# Patient Record
Sex: Male | Born: 2000 | Race: White | Hispanic: No | Marital: Single | Smoking: Never smoker
Health system: Southern US, Community
[De-identification: ages and names within clinical notes are randomized; demographics above are authoritative.]

## PROBLEM LIST (undated history)

## (undated) DIAGNOSIS — E049 Nontoxic goiter, unspecified: Secondary | ICD-10-CM

## (undated) DIAGNOSIS — E1010 Type 1 diabetes mellitus with ketoacidosis without coma: Secondary | ICD-10-CM

## (undated) DIAGNOSIS — E109 Type 1 diabetes mellitus without complications: Secondary | ICD-10-CM

## (undated) DIAGNOSIS — F909 Attention-deficit hyperactivity disorder, unspecified type: Secondary | ICD-10-CM

## (undated) DIAGNOSIS — R6250 Unspecified lack of expected normal physiological development in childhood: Secondary | ICD-10-CM

## (undated) DIAGNOSIS — E11649 Type 2 diabetes mellitus with hypoglycemia without coma: Secondary | ICD-10-CM

## (undated) HISTORY — DX: Unspecified lack of expected normal physiological development in childhood: R62.50

## (undated) HISTORY — DX: Type 1 diabetes mellitus with ketoacidosis without coma: E10.10

## (undated) HISTORY — DX: Type 2 diabetes mellitus with hypoglycemia without coma: E11.649

## (undated) HISTORY — DX: Attention-deficit hyperactivity disorder, unspecified type: F90.9

## (undated) HISTORY — DX: Nontoxic goiter, unspecified: E04.9

## (undated) HISTORY — DX: Type 1 diabetes mellitus without complications: E10.9

---
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
## 2020-01-02 ENCOUNTER — Other Ambulatory Visit (INDEPENDENT_AMBULATORY_CARE_PROVIDER_SITE_OTHER): Payer: Self-pay | Admitting: Family

## 2020-01-03 ENCOUNTER — Telehealth (INDEPENDENT_AMBULATORY_CARE_PROVIDER_SITE_OTHER): Payer: Self-pay | Admitting: Family

## 2020-01-03 MED ORDER — DEXCOM G6 SENSOR MISC: 1.0000 | 0 refills | Status: DC

## 2020-01-03 NOTE — Telephone Encounter (Signed)
  Who's calling (name and relationship to patient) : Abeer Iversen (mom)  Best contact number: [phone]  Provider they see: Gretchen Short  Reason for call: Patient has only one Dexcom sensor left and needs refill sent to pharmacy. Mom states that [facility] told her that Rx would need to be sent to local pharmacy. She would [facility] and also to [facility], if possible so she can compare pricing. If it is not possible to send to two pharmacies, than just [facility] the [facility].    PRESCRIPTION REFILL ONLY  Name of prescription: Continuous Blood Gluc Sensor (DEXCOM G6 SENSOR) MISC  [id number] [place], [place] - [address]

## 2020-01-03 NOTE — Addendum Note (Signed)
Addended by: Osa Craver on: 01/03/2020 10:28 AM   Modules accepted: Orders

## 2020-01-17 ENCOUNTER — Ambulatory Visit (INDEPENDENT_AMBULATORY_CARE_PROVIDER_SITE_OTHER): Payer: Managed Care, Other (non HMO) | Admitting: Family

## 2020-02-01 ENCOUNTER — Ambulatory Visit (INDEPENDENT_AMBULATORY_CARE_PROVIDER_SITE_OTHER): Payer: Managed Care, Other (non HMO) | Admitting: Family

## 2020-02-01 ENCOUNTER — Encounter (INDEPENDENT_AMBULATORY_CARE_PROVIDER_SITE_OTHER): Payer: Self-pay | Admitting: Family

## 2020-02-01 ENCOUNTER — Other Ambulatory Visit: Payer: Self-pay

## 2020-02-01 VITALS — BP 114/72 | HR 78 | Ht 69.0 in | Wt 165.4 lb

## 2020-02-01 DIAGNOSIS — E1065 Type 1 diabetes mellitus with hyperglycemia: Secondary | ICD-10-CM

## 2020-02-01 DIAGNOSIS — F54 Psychological and behavioral factors associated with disorders or diseases classified elsewhere: Secondary | ICD-10-CM

## 2020-02-01 DIAGNOSIS — R739 Hyperglycemia, unspecified: Secondary | ICD-10-CM | POA: Diagnosis not present

## 2020-02-01 DIAGNOSIS — Z4681 Encounter for fitting and adjustment of insulin pump: Secondary | ICD-10-CM

## 2020-02-01 DIAGNOSIS — E10649 Type 1 diabetes mellitus with hypoglycemia without coma: Secondary | ICD-10-CM | POA: Diagnosis not present

## 2020-02-01 DIAGNOSIS — R7309 Other abnormal glucose: Secondary | ICD-10-CM

## 2020-02-01 LAB — POCT GLUCOSE (DEVICE FOR HOME USE): Glucose Fasting, POC: 131 mg/dL — AB (ref 70–99)

## 2020-02-01 LAB — POCT GLYCOSYLATED HEMOGLOBIN (HGB A1C): Hemoglobin A1C: 8.1 % — AB (ref 4.0–5.6)

## 2020-02-01 NOTE — Progress Notes (Signed)
Pediatric Endocrinology Diabetes Consultation Follow-up Visit  Ethan Acevedo 2000-07-28 [id number]  Chief Complaint: Follow-up type 1 diabetes   [name], MD   HPI: Ethan Acevedo  is a 19 y.o. male presenting for follow-up of type 1 diabetes. he is accompanied to this visit by his mother.   1. Ethan Acevedo was admitted to pediatric ward of the [facility] on 01/28/07 for new onset type 1 diabetes mellitus, mild-moderate diabetic ketoacidosis, dehydration, and ketonuria. He was 94-1/19 years old. He had about a 3-week history of polyuria, polydipsia, and nocturia, and a one-week history of unusual enuresis. The parents brought the child to his pediatrician, Dr. [name]. Dr. [name] checked his BG. The BG was "high" (greater than 500). Urinalysis showed 3+ glucose and 3+ ketones. We started him on Lantus as a basal insulin at bedtime and Novolog aspart as a bolus insulin at meals and at bedtime and 2 AM as needed. On  12/10/07 we converted him to a Medtronic 722 insulin pump.    2. Since last visit to [facility] on 05/2019, he has been well.  No ER visits or hospitalizations.   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 Over the summer he has stayed busy traveling and working with his family. Using Medtronic 670g insulin pump with Dexcom CGM. HE plans to upgrade to Tslim insulin pump in January.   He reports that his blood sugars have been good overall. He does not feel like he is having frequent hypoglycemia or hyperglycemia. Mom feels like basal changes made at last visit were very helpful in preventing hypoglycemia.   Mom does notice that when he gives a bolus, it usually drops his blood sugar "to much".    Insulin regimen: Medtronic 670g Basal Rates 12AM 1.15  4am 1.20  8am 1.25  1pm 1.35       Insulin to Carbohydrate Ratio 12AM 10  6am 6  11am 7  11pm 9       Insulin Sensitivity Factor 12AM 40  6am 35  11pm 40         Target Blood Glucose 12AM 150  6am 120   9pm 150           Hypoglycemia: Able to feel low blood sugars.  No glucagon needed recently.  Insulin Pump download   - Using 69 units pre day   - 56% bolus and 44% basal   - Entering 234 grams of carbs per day.   - Dexcom CGM download     Med-alert ID: Not currently wearing.  Injection sites: Legs.  Annual labs due: 05/2020 Ophthalmology due: 2020     3. ROS: Greater than 10 systems reviewed with pertinent positives listed in HPI, otherwise neg. Constitutional: Sleeping well. Weight stable.  Eyes: No changes in vision. . No blurry vision  Ears/Nose/Mouth/Throat: No difficulty swallowing. Cardiovascular: No palpitations. Denies chest pain  Respiratory: No increased work of breathing Gastrointestinal: No constipation or diarrhea. No abdominal pain Genitourinary: No nocturia, no polyuria Musculoskeletal: No joint pain Neurologic: Normal sensation, no tremor Endocrine: No polydipsia.  No hyperpigmentation Psychiatric: Normal affect  Past Medical History:   Past Medical History:  Diagnosis Date  . ADHD (attention deficit hyperactivity disorder)   . Diabetic ketoacidosis, type I ([facility])   . Goiter   . Hypoglycemia associated with diabetes ([facility])   . Physical growth delay   . Type 1 diabetes mellitus in patient age 75-12 years with HbA1C goal below 8  Medications:  Outpatient Encounter Medications as of 02/01/2020  Medication Sig  . Blood Glucose Monitoring Suppl (CONTOUR NEXT MONITOR) w/Device KIT Please specify directions, refills and quantity  . Continuous Blood Gluc Sensor (DEXCOM G6 SENSOR) MISC 1 each by Does not apply route as directed. 1 sensor every 10 days  . Continuous Blood Gluc Transmit (DEXCOM G6 TRANSMITTER) MISC 1 each by Does not apply route every 3 (three) months.  . insulin aspart (NOVOLOG) 100 UNIT/ML injection USE 300 UNITS IN INSULIN PUMP EVERY 48 HOURS  . Lancets Misc. (ACCU-CHEK MULTICLIX LANCET DEV) KIT USE AS DIRECTED  . Urine Glucose-Ketones  Test STRP Use to check urine in cases of hyperglycemia  . Blood Glucose Monitoring Suppl (CONTOUR NEXT EZ) w/Device KIT 1 kit by Does not apply route daily as needed. (Patient not taking: Reported on 06/07/2019)  . Glucagon (BAQSIMI TWO PACK) 3 MG/DOSE POWD Place 1 kit into the nose as needed (for severe Hypoglycemia that he cannot eat ot drink). (Patient not taking: Reported on 06/24/2018)  . glucagon 1 MG injection Follow package directions for low blood sugar.  Marland Kitchen glucose blood (BAYER CONTOUR NEXT TEST) test strip Check BG 6x day (Patient not taking: Reported on 06/07/2019)  . ibuprofen (ADVIL,MOTRIN) 400 MG tablet Take 1 tablet (400 mg total) by mouth every 6 (six) hours as needed. (Patient not taking: Reported on 09/25/2016)  . insulin aspart (NOVOLOG FLEXPEN) 100 UNIT/ML injection Up to 50 units daily as directed by MD (Patient not taking: Reported on 06/24/2018)  . insulin aspart (NOVOLOG) 100 UNIT/ML injection USE 300 UNITS IN INSULIN PUMP EVERY 48 HOURS (Patient not taking: Reported on 06/07/2019)  . Insulin Glargine (LANTUS SOLOSTAR) 100 UNIT/ML Solostar Pen Up to 50 units per day as directed by MD (Patient not taking: Reported on 04/09/2016)  . lisdexamfetamine (VYVANSE) 70 MG capsule Take 30 mg by mouth every morning.  (Patient not taking: Reported on 02/01/2020)  . mupirocin cream (BACTROBAN) 2 % APPLY 1 APPLICATION TOPICALLY 3 (THREE) TIMES DAILY. (Patient not taking: Reported on 09/25/2016)   No facility-administered encounter medications on file as of 02/01/2020.    Allergies: No Known Allergies  Surgical History: No past surgical history on file.  Family History:  Family History  Problem Relation Age of Onset  . Thyroid disease Mother        Papillary thyroid cancer  . Cancer Mother      Social History: Lives with: Mother, father and older brother  Levora Dredge at [employer] [facility] [employer]     Physical Exam:  Vitals:   02/01/20 0921  BP: 114/72  Pulse: 78  Weight: 165 lb 6.4 oz (75  kg)  Height: '5\' 9"'  (1.753 m)   BP 114/72   Pulse 78   Ht '5\' 9"'  (1.753 m)   Wt 165 lb 6.4 oz (75 kg)   BMI 24.43 kg/m  Body mass index: body mass index is 24.43 kg/m. Blood pressure percentiles are not available for patients who are 18 years or older.    Ht Readings from Last 3 Encounters:  02/01/20 '5\' 9"'  (1.753 m) (42 %, Z= -0.21)*  06/07/19 5' 9.06" (1.754 m) (44 %, Z= -0.16)*  10/26/18 5' 7.72" (1.72 m) (28 %, Z= -0.59)*   * Growth percentiles are based on CDC (Boys, 2-20 Years) data.   Wt Readings from Last 3 Encounters:  02/01/20 165 lb 6.4 oz (75 kg) (67 %, Z= 0.43)*  06/07/19 164 lb 6.4 oz (74.6 kg) (69 %, Z= 0.48)*  10/26/18  157 lb 3.2 oz (71.3 kg) (63 %, Z= 0.32)*   * Growth percentiles are based on CDC (Boys, 2-20 Years) data.   General: Well developed, well nourished male in no acute distress.   Head: Normocephalic, atraumatic.   Eyes:  Pupils equal and round. EOMI.  Sclera white.  No eye drainage.   Ears/Nose/Mouth/Throat: Nares patent, no nasal drainage.  Normal dentition, mucous membranes moist.  Neck: supple, no cervical lymphadenopathy, no thyromegaly Cardiovascular: regular rate, normal S1/S2, no murmurs Respiratory: No increased work of breathing.  Lungs clear to auscultation bilaterally.  No wheezes. Abdomen: soft, nontender, nondistended. Normal bowel sounds.  No appreciable masses  Extremities: warm, well perfused, cap refill < 2 sec.   Musculoskeletal: Normal muscle mass.  Normal strength Skin: warm, dry.  No rash or lesions. Neurologic: alert and oriented, normal speech, no tremor    Labs: Last hemoglobin A1c:8.9% on 06/2018   Results for orders placed or performed in visit on 02/01/20  POCT Glucose (Device for Home Use)  Result Value Ref Range   Glucose Fasting, POC 131 (A) 70 - 99 mg/dL   POC Glucose    POCT glycosylated hemoglobin (Hb A1C)  Result Value Ref Range   Hemoglobin A1C 8.1 (A) 4.0 - 5.6 %   HbA1c POC (<> result, manual entry)      HbA1c, POC (prediabetic range)     HbA1c, POC (controlled diabetic range)      Results for orders placed or performed in visit on 02/01/20  POCT Glucose (Device for Home Use)  Result Value Ref Range   Glucose Fasting, POC 131 (A) 70 - 99 mg/dL   POC Glucose    POCT glycosylated hemoglobin (Hb A1C)  Result Value Ref Range   Hemoglobin A1C 8.1 (A) 4.0 - 5.6 %   HbA1c POC (<> result, manual entry)     HbA1c, POC (prediabetic range)     HbA1c, POC (controlled diabetic range)      Assessment/Plan: Ethan Acevedo is a 19 y.o. male with uncontrolled type 1 diabetes on insulin pump and CGM therapy. He is having hypoglycemia after bolusing, will reduce carb ratio and sensitivity factor. Hemoglobin A1c is 8.1% which is higher then ADA goal of <7%. Will benefit from closed loop insulin pump.   1. DM w/o complication type I, uncontrolled (HCC)/ 2. Hyperglycemia/ 3 hypoglycemia/ 4. Elevated hemoglobin A1c  - Reviewed insulin pump and CGM download. Discussed trends and patterns.  - Rotate pump sites to prevent scar tissue.  - bolus 15 minutes prior to eating to limit blood sugar spikes.  - Reviewed carb counting and importance of accurate carb counting.  - Discussed signs and symptoms of hypoglycemia. Always have glucose available.  - POCT glucose and hemoglobin A1c  - Reviewed growth chart.  - Discussed current and future diabetes technology.   5. Insulin pump titration/insulin pump in place.    Insulin to Carbohydrate Ratio 12AM 10  6am 6--> 7   11am 7--> 8   11pm 9--> 10        Insulin Sensitivity Factor 12AM 40  6am 35--> 40   11pm 40--> 45           6. Maladaptive health behaviors affecting medical condition - Discussed balancing diabetes with school, work and activity  - Encouraged follow up every 3 months - Discussed barriers to care.    Follow-up:   3 months. Send mychart message as needed for updates.   Medical decision-making:  >45 spent today reviewing the  medical  chart, counseling the patient/family, and documenting today's visit.   When a patient is on insulin, intensive monitoring of blood glucose levels is necessary to avoid hyperglycemia and hypoglycemia. Severe hyperglycemia/hypoglycemia can lead to hospital admissions and be life threatening.    Hermenia Bers,  FNP-C  Pediatric Specialist  [address] [place]  [place], [address]  Tele: [phone]

## 2020-02-01 NOTE — Patient Instructions (Signed)

## 2020-02-06 ENCOUNTER — Telehealth (INDEPENDENT_AMBULATORY_CARE_PROVIDER_SITE_OTHER): Payer: Self-pay | Admitting: Family

## 2020-02-06 DIAGNOSIS — E1065 Type 1 diabetes mellitus with hyperglycemia: Secondary | ICD-10-CM

## 2020-02-06 DIAGNOSIS — IMO0002 Reserved for concepts with insufficient information to code with codable children: Secondary | ICD-10-CM

## 2020-02-06 MED ORDER — BAQSIMI TWO PACK 3 MG/DOSE NA POWD: 1.0000 | NASAL | 1 refills | Status: DC | PRN

## 2020-02-06 NOTE — Telephone Encounter (Signed)
°  Who's calling (name and relationship to patient) : Jun,Anna Best contact number: [phone] Provider they see: Ovidio Kin Reason for call: [facility], mom would like to see if Jos's new insurance will now cover this.     PRESCRIPTION REFILL ONLY  Name of prescription:  Pharmacy:

## 2020-02-06 NOTE — Telephone Encounter (Signed)
Refill sent.

## 2020-02-07 ENCOUNTER — Telehealth (INDEPENDENT_AMBULATORY_CARE_PROVIDER_SITE_OTHER): Payer: Self-pay

## 2020-02-07 NOTE — Telephone Encounter (Signed)
Received denial for Dexcom PA from [employer], the authorization was for 72 sensors a year and Rosann Auerbach will cover 3 sensors every 30 days.  called pharmacy to follow up, he was able to pick up script on 8/11

## 2020-02-26 ENCOUNTER — Other Ambulatory Visit (INDEPENDENT_AMBULATORY_CARE_PROVIDER_SITE_OTHER): Payer: Self-pay | Admitting: Family

## 2020-03-26 ENCOUNTER — Telehealth (INDEPENDENT_AMBULATORY_CARE_PROVIDER_SITE_OTHER): Payer: Self-pay | Admitting: Family

## 2020-03-26 MED ORDER — INSULIN LISPRO 100 UNIT/ML ~~LOC~~ SOLN
SUBCUTANEOUS | 11 refills | Status: DC
Start: 2020-03-26 — End: 2021-05-07

## 2020-03-26 NOTE — Telephone Encounter (Signed)
Humalog sent to requested pharmacy.

## 2020-03-26 NOTE — Telephone Encounter (Signed)
°  Who's calling (name and relationship to patient) : Viveiros,Anna Best contact number: [phone] Provider they see: Ovidio Kin  Reason for call: [facility] [place].  Mom is currently working with insurance to get the PA done for [employer].  [facility] is out of all insuline and mom would like him to have a Humalog RX for backup just incase he can't get his Novolog today.    [facility] [address] [place], [place] [address]   PRESCRIPTION REFILL ONLY  Name of prescription:  Pharmacy:

## 2020-04-10 ENCOUNTER — Telehealth (INDEPENDENT_AMBULATORY_CARE_PROVIDER_SITE_OTHER): Payer: Self-pay | Admitting: Family

## 2020-04-10 NOTE — Telephone Encounter (Signed)
Mom states that patient is also having headaches since starting the Humalog.

## 2020-04-10 NOTE — Telephone Encounter (Signed)
We will do a PA for the Novolog that is all we can do at this time. She may need to call her insurance company to discuss options.

## 2020-04-10 NOTE — Telephone Encounter (Signed)
°  Who's calling (name and relationship to patient) : Tobi Bastos (mom - on DPR)  Best contact number: [phone]  Provider they see: Gretchen Short  Reason for call: Mom states that their insurance does not pay for Novolog, so patient was switched to Humalog, but this does not seem to work as well for him. She states that his numbers have been terrible since switching and she is wondering if we can try to get insurance to cover Novolog.    PRESCRIPTION REFILL ONLY  Name of prescription:  Pharmacy:

## 2020-04-10 NOTE — Telephone Encounter (Signed)
Spoke with mom. She said that she doesn't like the humalog because it responds slower than Novolg. I told her that I can initiate a PA but he doesn't have an allergic reaction to document so I cant guarantee the approval. But will start one.

## 2020-04-10 NOTE — Telephone Encounter (Signed)
Mom is requesting a smaller amount of novolog sent to [facility] pharmacy until the insurance gets worked out for the whole prescription. Mom states that the insurance will be for small quantity.

## 2020-04-10 NOTE — Telephone Encounter (Addendum)
Spoke with mom to clarify what the insurance deems to be a small quantity.  She was not sure. I asked her to find out for sure so we know what to write the script for.  She expressed her concerns that this process for getting Novolog is too much and today was her day off to get this done.  I explained that I could send the script for a small amount but if that is not what the insurance is going to pay for she may end up in the same predicament of being short insulin til the insurance will cover it.  I recommended that she get the exact quantity the insurance will pay for and call us back with that information so we can send in the correct script.  I also let her know that I am checking on the status of the PA.

## 2020-04-10 NOTE — Telephone Encounter (Signed)
° ° ° °  Based on the reports it appears his blood sugars have been most consistently running highest at night. His last Dexcom Download on 01/2020 showed avg bg of 220 which is very consistent with this report. We can submit to ask insurance to allow the changed but typically if he does not have a documented reaction to it then it will not be covered.

## 2020-04-10 NOTE — Telephone Encounter (Signed)
Mom wanted to [facility] to know that Ethan Acevedo has also had stomach pain and diarrhea  since starting the Humalog.

## 2020-04-13 NOTE — Telephone Encounter (Signed)
Lenore Manner (Key: [id number])  [facility] is reviewing your PA request and will respond within 24 hours for Medicaid or up to 72 hours for non-Medicaid plans, based on the required timeframe determined by state or federal regulations. To check for an update later, open this request from your dashboard.

## 2020-05-15 ENCOUNTER — Other Ambulatory Visit (INDEPENDENT_AMBULATORY_CARE_PROVIDER_SITE_OTHER): Payer: Self-pay

## 2020-05-15 MED ORDER — DEXCOM G6 TRANSMITTER MISC: 1.0000 | 3 refills | Status: DC

## 2020-06-12 ENCOUNTER — Ambulatory Visit (INDEPENDENT_AMBULATORY_CARE_PROVIDER_SITE_OTHER): Payer: Managed Care, Other (non HMO) | Admitting: Family

## 2020-06-13 ENCOUNTER — Telehealth (INDEPENDENT_AMBULATORY_CARE_PROVIDER_SITE_OTHER): Payer: Self-pay | Admitting: Family

## 2020-06-13 ENCOUNTER — Ambulatory Visit (INDEPENDENT_AMBULATORY_CARE_PROVIDER_SITE_OTHER): Payer: Managed Care, Other (non HMO) | Admitting: Family

## 2020-06-13 NOTE — Telephone Encounter (Signed)
Who's calling (name and relationship to patient) : Sandrea Hammond mom  Best contact number: [phone]  Provider they see: Joesph Fillers  Reason for call: Mom wanted to speak to someone about obtaining a t-slim  Call ID:      PRESCRIPTION REFILL ONLY  Name of prescription:  Pharmacy:

## 2020-06-13 NOTE — Telephone Encounter (Signed)
Spoke with mom. She said that she has already spoken with Thayer Ohm regarding the t-slaim. He said that legally [place] current pump would have to go out of warranty before he could submit a request for a t-slim. [employer] pump went out of warrant yesterday. I called chris and left a message for him to return my call regarding this. I will call mom back when he calls me back letting me know the next steps.

## 2020-06-13 NOTE — Progress Notes (Deleted)
Pediatric Endocrinology Diabetes Consultation Follow-up Visit  Ethan Acevedo 2001/05/13 [id number]  Chief Complaint: Follow-up type 1 diabetes   [name], MD   HPI: Ethan Acevedo  is a 19 y.o. male presenting for follow-up of type 1 diabetes. he is accompanied to this visit by his mother.   1. Parke was admitted to pediatric ward of the [facility] on 01/28/07 for new onset type 1 diabetes mellitus, mild-moderate diabetic ketoacidosis, dehydration, and ketonuria. He was 41-1/19 years old. He had about a 3-week history of polyuria, polydipsia, and nocturia, and a one-week history of unusual enuresis. The parents brought the child to his pediatrician, Dr. [name]. Dr. [name] checked his BG. The BG was "high" (greater than 500). Urinalysis showed 3+ glucose and 3+ ketones. We started him on Lantus as a basal insulin at bedtime and Novolog aspart as a bolus insulin at meals and at bedtime and 2 AM as needed. On  12/10/07 we converted him to a Medtronic 722 insulin pump.    2. Since last visit to [facility] on 01/2020 , he has been well.  No ER visits or hospitalizations.   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 Over the summer he has stayed busy traveling and working with his family. Using Medtronic 670g insulin pump with Dexcom CGM. HE plans to upgrade to Tslim insulin pump in January.   He reports that his blood sugars have been good overall. He does not feel like he is having frequent hypoglycemia or hyperglycemia. Mom feels like basal changes made at last visit were very helpful in preventing hypoglycemia.   Mom does notice that when he gives a bolus, it usually drops his blood sugar "to much".    Insulin regimen: Medtronic 670g Basal Rates 12AM 1.15  4am 1.20  8am 1.25  1pm 1.35       Insulin to Carbohydrate Ratio 12AM 10  6am 7  11am 8  11pm 10       Insulin Sensitivity Factor 12AM 40  6am 40  11pm 45         Target Blood Glucose 12AM 150  6am 120   9pm 150           Hypoglycemia: Able to feel low blood sugars.  No glucagon needed recently.  Insulin Pump download   - Using 69 units pre day   - 56% bolus and 44% basal   - Entering 234 grams of carbs per day.   - Dexcom CGM download     Med-alert ID: Not currently wearing.  Injection sites: Legs.  Annual labs due: 05/2020 Ophthalmology due: 2020     3. ROS: Greater than 10 systems reviewed with pertinent positives listed in HPI, otherwise neg. Constitutional: Sleeping well. Weight stable.  Eyes: No changes in vision. . No blurry vision  Ears/Nose/Mouth/Throat: No difficulty swallowing. Cardiovascular: No palpitations. Denies chest pain  Respiratory: No increased work of breathing Gastrointestinal: No constipation or diarrhea. No abdominal pain Genitourinary: No nocturia, no polyuria Musculoskeletal: No joint pain Neurologic: Normal sensation, no tremor Endocrine: No polydipsia.  No hyperpigmentation Psychiatric: Normal affect  Past Medical History:   Past Medical History:  Diagnosis Date  . ADHD (attention deficit hyperactivity disorder)   . Diabetic ketoacidosis, type I ([place])   . Goiter   . Hypoglycemia associated with diabetes ([place])   . Physical growth delay   . Type 1 diabetes mellitus in patient age 66-12 years with HbA1C goal below 8  Medications:  Outpatient Encounter Medications as of 06/13/2020  Medication Sig  . Blood Glucose Monitoring Suppl (CONTOUR NEXT EZ) w/Device KIT 1 kit by Does not apply route daily as needed. (Patient not taking: Reported on 06/07/2019)  . Blood Glucose Monitoring Suppl (CONTOUR NEXT MONITOR) w/Device KIT Please specify directions, refills and quantity  . Continuous Blood Gluc Sensor (DEXCOM G6 SENSOR) MISC APPLY 1 SENSOR EVERY 10 DAYS  . Continuous Blood Gluc Transmit (DEXCOM G6 TRANSMITTER) MISC 1 each by Does not apply route every 3 (three) months.  . Glucagon (BAQSIMI TWO PACK) 3 MG/DOSE POWD Place 1 kit into the nose  as needed (for severe Hypoglycemia that he cannot eat ot drink).  Marland Kitchen glucagon 1 MG injection Follow package directions for low blood sugar.  Marland Kitchen glucose blood (BAYER CONTOUR NEXT TEST) test strip Check BG 6x day (Patient not taking: Reported on 06/07/2019)  . ibuprofen (ADVIL,MOTRIN) 400 MG tablet Take 1 tablet (400 mg total) by mouth every 6 (six) hours as needed. (Patient not taking: Reported on 09/25/2016)  . insulin aspart (NOVOLOG FLEXPEN) 100 UNIT/ML injection Up to 50 units daily as directed by MD (Patient not taking: Reported on 06/24/2018)  . insulin aspart (NOVOLOG) 100 UNIT/ML injection USE 300 UNITS IN INSULIN PUMP EVERY 48 HOURS (Patient not taking: Reported on 06/07/2019)  . insulin aspart (NOVOLOG) 100 UNIT/ML injection USE 300 UNITS IN INSULIN PUMP EVERY 48 HOURS  . Insulin Glargine (LANTUS SOLOSTAR) 100 UNIT/ML Solostar Pen Up to 50 units per day as directed by MD (Patient not taking: Reported on 04/09/2016)  . insulin lispro (HUMALOG) 100 UNIT/ML injection USE 300 UNITS IN INSULIN PUMP EVERY 48 HOURS  . Lancets Misc. (ACCU-CHEK MULTICLIX LANCET DEV) KIT USE AS DIRECTED  . lisdexamfetamine (VYVANSE) 70 MG capsule Take 30 mg by mouth every morning.  (Patient not taking: Reported on 02/01/2020)  . mupirocin cream (BACTROBAN) 2 % APPLY 1 APPLICATION TOPICALLY 3 (THREE) TIMES DAILY. (Patient not taking: Reported on 09/25/2016)  . Urine Glucose-Ketones Test STRP Use to check urine in cases of hyperglycemia   No facility-administered encounter medications on file as of 06/13/2020.    Allergies: No Known Allergies  Surgical History: No past surgical history on file.  Family History:  Family History  Problem Relation Age of Onset  . Thyroid disease Mother        Papillary thyroid cancer  . Cancer Mother      Social History: Lives with: Mother, father and older brother  Levora Dredge at [facility] [employer]     Physical Exam:  There were no vitals filed for this visit. There were no  vitals taken for this visit. Body mass index: body mass index is unknown because there is no height or weight on file. Blood pressure percentiles are not available for patients who are 18 years or older.    Ht Readings from Last 3 Encounters:  02/01/20 _0  (1.753 m) (42 %, Z= -0.21)*  06/07/19 5' 9.06" (1.754 m) (44 %, Z= -0.16)*  10/26/18 5' 7.72" (1.72 m) (28 %, Z= -0.59)*   * Growth percentiles are based on CDC (Boys, 2-20 Years) data.   Wt Readings from Last 3 Encounters:  02/01/20 165 lb 6.4 oz (75 kg) (67 %, Z= 0.43)*  06/07/19 164 lb 6.4 oz (74.6 kg) (69 %, Z= 0.48)*  10/26/18 157 lb 3.2 oz (71.3 kg) (63 %, Z= 0.32)*   * Growth percentiles are based on CDC (Boys, 2-20 Years) data.   General: Well developed,  well nourished male in no acute distress.   Head: Normocephalic, atraumatic.   Eyes:  Pupils equal and round. EOMI.  Sclera white.  No eye drainage.   Ears/Nose/Mouth/Throat: Nares patent, no nasal drainage.  Normal dentition, mucous membranes moist.  Neck: supple, no cervical lymphadenopathy, no thyromegaly Cardiovascular: regular rate, normal S1/S2, no murmurs Respiratory: No increased work of breathing.  Lungs clear to auscultation bilaterally.  No wheezes. Abdomen: soft, nontender, nondistended. Normal bowel sounds.  No appreciable masses  Extremities: warm, well perfused, cap refill < 2 sec.   Musculoskeletal: Normal muscle mass.  Normal strength Skin: warm, dry.  No rash or lesions. Neurologic: alert and oriented, normal speech, no tremor   Labs: Last hemoglobin A1c:8.1% on 01/2020    Results for orders placed or performed in visit on 02/01/20  POCT Glucose (Device for Home Use)  Result Value Ref Range   Glucose Fasting, POC 131 (A) 70 - 99 mg/dL   POC Glucose    POCT glycosylated hemoglobin (Hb A1C)  Result Value Ref Range   Hemoglobin A1C 8.1 (A) 4.0 - 5.6 %   HbA1c POC (<> result, manual entry)     HbA1c, POC (prediabetic range)     HbA1c, POC  (controlled diabetic range)      Results for orders placed or performed in visit on 02/01/20  POCT Glucose (Device for Home Use)  Result Value Ref Range   Glucose Fasting, POC 131 (A) 70 - 99 mg/dL   POC Glucose    POCT glycosylated hemoglobin (Hb A1C)  Result Value Ref Range   Hemoglobin A1C 8.1 (A) 4.0 - 5.6 %   HbA1c POC (<> result, manual entry)     HbA1c, POC (prediabetic range)     HbA1c, POC (controlled diabetic range)      Assessment/Plan: Donyea is a 19 y.o. male with uncontrolled type 1 diabetes on insulin pump and CGM therapy. He is having hypoglycemia after bolusing, will reduce carb ratio and sensitivity factor. Hemoglobin A1c is 8.1% which is higher then ADA goal of <7%. Will benefit from closed loop insulin pump.   1. DM w/o complication type I, uncontrolled (HCC)/ 2. Hyperglycemia/ 3 hypoglycemia/ 4. Elevated hemoglobin A1c  - Reviewed insulin pump and CGM download. Discussed trends and patterns.  - Rotate pump sites to prevent scar tissue.  - bolus 15 minutes prior to eating to limit blood sugar spikes.  - Reviewed carb counting and importance of accurate carb counting.  - Discussed signs and symptoms of hypoglycemia. Always have glucose available.  - POCT glucose and hemoglobin A1c  - Reviewed growth chart.    5. Insulin pump titration/insulin pump in place.      6. Maladaptive health behaviors affecting medical condition - Discussed concerns and barriers to care  - Answered questions.   Follow-up:   3 months. Send mychart message as needed for updates.   Medical decision-making:  >45 spent today reviewing the medical chart, counseling the patient/family, and documenting today's visit.   When a patient is on insulin, intensive monitoring of blood glucose levels is necessary to avoid hyperglycemia and hypoglycemia. Severe hyperglycemia/hypoglycemia can lead to hospital admissions and be life threatening.    Hermenia Bers,  FNP-C  Pediatric Specialist   [address] [place]  [place], [address]  Tele: [phone]

## 2020-06-14 ENCOUNTER — Encounter: Payer: Self-pay | Admitting: Nurse Practitioner

## 2020-06-14 NOTE — Telephone Encounter (Signed)
Papers faxed to chris from [employer]

## 2020-06-14 NOTE — Telephone Encounter (Signed)
Chris from [employer] is supposed to be faxing the paperwork. I will give it to [name] to sign when I receive it.

## 2020-06-20 ENCOUNTER — Telehealth (INDEPENDENT_AMBULATORY_CARE_PROVIDER_SITE_OTHER): Payer: Self-pay | Admitting: Family

## 2020-06-20 NOTE — Telephone Encounter (Signed)
  Who's calling (name and relationship to patient) : Tobi Bastos (mom)  Best contact number: [phone]  Provider they see: Gretchen Short  Reason for call: Mom would like to know if we can do a verbal prior auth for Novolog.    PRESCRIPTION REFILL ONLY  Name of prescription:  Pharmacy:

## 2020-06-20 NOTE — Telephone Encounter (Signed)
[facility] is reviewing your PA request and will respond within 24 hours for Medicaid or up to 72 hours for non-Medicaid plans, based on the required timeframe determined by state or federal regulations. To check for an update later, open this request from your dashboard.

## 2020-06-21 ENCOUNTER — Telehealth (INDEPENDENT_AMBULATORY_CARE_PROVIDER_SITE_OTHER): Payer: Self-pay

## 2020-06-21 LAB — HM DIABETES EYE EXAM

## 2020-06-21 NOTE — Telephone Encounter (Signed)
I called [employer] PA line [phone]. They said that a verbal PA and the PA that I have done online for the Novolog are the same thing. That if we cancel the one that we have done online then it will push the process back until Monday with starting the 3-5 business days. She did say that if the medication got denied then mom could call and try to appeal it. I called mom to let her know all of this. She was satisfied with the outcome of the call. I also let her know that I have made our Pharmacist aware of the situation incase she has any other suggestions. Mom agreed with everything and ended call.

## 2020-06-21 NOTE — Telephone Encounter (Signed)
I called and spoke with mother. She reports that Ethan Acevedo has diarrhea since being on Humalog and does not feel his blood sugars are as well controlled. Mother is also applying for a Tslim insulin pump which she hopes will help with blood sugar control. Mother gave phone number for PA but understands that insurance will need to approve change.

## 2020-06-21 NOTE — Telephone Encounter (Signed)
No other alternatives that I know of. Lumjev is an ultra rapid made by the same company but I am not sure that it would be covered and it is not approved in the Medtronic or Tslim close loop pumps. If insurance will do a peer to peer I can do it but it is unlikely to get approved if he is not having an allergic reaction.

## 2020-06-21 NOTE — Telephone Encounter (Signed)
Mom called upset that I didn't call and do a peer to peer for [employer]. Her insurance does not cover the novolog if the patient has not had a serious adverse reaction to the humalog. She said that the patient has diarrhea from the humalog. I let her know that I did put in another PA and did put in that he has diarrhea when using the humalog. I told her that the PA can take up to 72 hours to be approved or denied. But she can call her insurance about the novolog. I have exhausted everything on my end regarding trying to get the Novolog approved. I have done PA's for this patient in the past for the same medication and we have went over that her insurance does not cover Novolog. If the PA comes back denied I will call the insurance company then and see if there is anything further I can do to try to help.

## 2020-07-05 ENCOUNTER — Telehealth (INDEPENDENT_AMBULATORY_CARE_PROVIDER_SITE_OTHER): Payer: Self-pay

## 2020-07-05 NOTE — Telephone Encounter (Signed)
Lenore Manner (Key: [id number]) Rx #: [id number] NovoLOG 100UNIT/ML solution   Form [facility] Electronic PA Form [phone] NCPDP) Created 6 days ago Sent to Plan 40 minutes ago Plan Response 40 minutes ago Submit Clinical Questions 33 minutes ago Determination Wait for Determination Please wait for [facility] 2017 to return a determination.

## 2020-07-05 NOTE — Telephone Encounter (Signed)
Received PA from [facility] for Novolog. Other medications tried and failed. Humalog- Diarrhea.

## 2020-08-07 ENCOUNTER — Telehealth (INDEPENDENT_AMBULATORY_CARE_PROVIDER_SITE_OTHER): Payer: Self-pay

## 2020-08-08 NOTE — Telephone Encounter (Signed)
pump paper work

## 2020-08-24 NOTE — Progress Notes (Signed)
S:     Chief Complaint  Patient presents with  . Diabetes    [employer] Pump Start    Endocrinology provider: Gretchen Short, NP (no upcoming appt)  Patient referred to me by Gretchen Short, NP, for [employer] t:slim X2 insulin pump training. PMH significant for T1DM, goiter, and ADHD. Patient is currently using Dexcom G6 CGM and Medtronic 670g pump.  Patient presents today with his mother, Ethan Acevedo.  His total daily dose is 57 units. Patient states he feels his pump settings have improved since he made changes per Ethan Acevedo's prior guidance on last appt on 02/01/20. However, he does state he feels that he has to enter in additional carbs to lower BG reading after eating. Mother states she is concerned that she sees him frequently experience hypoglycemia. Patient reports he had significant diarrhea with Humalog. Mother is IT sales professional for $135/month.  Insurance: Med[employer] (PBM [employer]Rx)   DME Supplier: [facility]   Pump Serial Number: [id number]  Insulin regimen: Medtronic 670g Basal Rates 12AM 1.15  4am 1.20  8am 1.25  1pm 1.35       Insulin to Carbohydrate Ratio 12AM 10  6am 6  11am 7  11pm 9       Insulin Sensitivity Factor 12AM 40  6am 35  11pm 40         Target Blood Glucose 12AM 150  6am 120  9pm 150          Infusion Set: [employer]oft XC 6 mm cannula  [employer] T:Slim X2 Insulin Pump Education Training Please refer to Insulin Pump Training Checklist scanned into media  BG Before Training: 140  Dexcom Clarity Report    Medtronic Carelink Report   Assessment: [employer] Pump Education - [employer]andem t:slim X2 Insulin pump applied successfully to right leg. Insulin pump was synced with Dexcom G6 CGM to use Control IQ technology. Mother and patient appeared to have sufficient understanding of subjects discussed during [employer]andem t:slim X2 insulin pump training appt.   [employer] Pump Settings - Patient is currently receiving TDD of 57 units (~0.8  units/kg/day). He administers 40-60% of TDD as bolus but there is frequent variation. Patient does report experiencing hypoglycemia after dosing, but it is important to note in pump report patient will give a bolus (correction + food) then will ghost carb to lower BG readings. Considering his TIR is 35% and he is starting a closed loop pump attempted to update settings so bolus would be consistently 40% and basal would consistently be 60%. Considering his age it is reasonable to anticipate he may require 0.8 - 1.0 unit/kg/day and may need stronger pump settings. Will f/u in 1 week to re-assess. TDD = 57 Basal (TDD * 0.4) = 22 / 24 = 0.95 -12am-2am: 0.95 -2am-8am: 0.85 -8am-12am: 0.95 ICR = (450/TDD) = 20 --> 1:15 (considering how strong his settings were previously) ISF = (1800/TDD)  = 30  --> 1:30   Patient's insulin vial did not contain enough insulin for training - provided Novolog sample.  Medication Samples have been provided to the patient.  Drug name: Novolog U100 vial  Qty: 1   LOT: [id number]  Exp.Date: 02/19/2021   Humalog adverse effects - Thoroughly discussed pharmacokinetics and pharmacodynamics of Humalog vs Novolog. Also reviewed adverse effects reported in package insert of both medications as well as postmarketing data. When discussing Humalog mechanism of action in depth explained there is no underlying mechanism for why Humalog would cause diarrhea. In regards to diarrhea with Humalog  vs Novolog, I explained it is possible to experience an adverse effect from inactive ingredients within medications. However, upon further research Novolog has the exact same inactive ingredients as Humalog except Novolog ALSO has sodium chloride. Asked patient if he could recall foods he ate during time he was on Humalog; he was unsure of exact foods. He strongly believed diarrhea was attributed to Humalog though. However, since he was unable to remember specific details about diet when he tried Humalog  he was agreeable to retrial of Humalog and would inform me if intolerance persisted.   Medication Samples have been provided to the patient.  Drug name: Humalog U100 vial  Qty: 1   LOT: [id number]  Exp.Date: 07/23/2020  Plan: 1. [employer] T:Slim X2 Insulin Pump Doses Basal Rates 12AM 0.95  2AM 0.85  8AM 0.95          Insulin to Carbohydrate Ratio 12AM 15                Insulin Sensitivity Factor 12AM 30               Target Blood Glucose 12AM 120 (control IQ 110)               Patient will use Novolog for now but will retrial Humalog   2. [employer] T:Slim X2 Insulin Pump Education a. Continue to wear Tandem T:Slim insulin pump and change infusion set site every 3 days (cartridge filled 300 units) b. Thoroughly discussed how to assess bad infusion site change and appropriate management (notice BG is elevated, attempt to bolus via pump, recheck BG in 30 minutes, if BG has not decreased then disconnect pump and administer bolus via insulin pen, apply new infusion set, and repeat process).  a. Discussed back up plan if pump breaks (how to calculate insulin doses using insulin pens). Provided written copy of patient's current pump settings and handout explaining math on how to calculate settings. Discussed examples with family. Patient was able to use teach back method to demonstrate understanding of calculating dose for basal/bolus insulin pens from insulin pump settings.  i. Patient has Hospital doctor and Novolog Relion insulin pen refills to use as back up until 08/29/2021. Reminded family they will need a new prescription annually.  3. Reimbursement a. Faxed invoice and training checklist to [facility] 4. Follow Up:  a. 1 week virtual  Written patient instructions provided.    This appointment required 120 minutes of patient care (this includes precharting, chart review, review of results, face-to-face care, etc.).  Thank you for involving clinical pharmacist/diabetes  educator to assist in providing this patient's care.  Zachery Conch, PharmD, CPP, CDCES

## 2020-08-28 ENCOUNTER — Ambulatory Visit (INDEPENDENT_AMBULATORY_CARE_PROVIDER_SITE_OTHER): Payer: Managed Care, Other (non HMO) | Admitting: Pharmacist

## 2020-08-28 ENCOUNTER — Other Ambulatory Visit: Payer: Self-pay

## 2020-08-28 VITALS — Ht 68.94 in | Wt 159.8 lb

## 2020-08-28 DIAGNOSIS — E108 Type 1 diabetes mellitus with unspecified complications: Secondary | ICD-10-CM | POA: Diagnosis not present

## 2020-08-28 DIAGNOSIS — E1065 Type 1 diabetes mellitus with hyperglycemia: Secondary | ICD-10-CM

## 2020-08-28 DIAGNOSIS — IMO0002 Reserved for concepts with insufficient information to code with codable children: Secondary | ICD-10-CM

## 2020-08-28 LAB — POCT GLYCOSYLATED HEMOGLOBIN (HGB A1C): Hemoglobin A1C: 6.9 % — AB (ref 4.0–5.6)

## 2020-08-28 LAB — POCT GLUCOSE (DEVICE FOR HOME USE): POC Glucose: 140 mg/dl — AB (ref 70–99)

## 2020-08-29 MED ORDER — INSULIN ASPART 100 UNIT/ML ~~LOC~~ SOLN: SUBCUTANEOUS | 11 refills | Status: DC

## 2020-08-29 MED ORDER — NOVOLOG FLEXPEN RELION 100 UNIT/ML ~~LOC~~ SOPN: PEN_INJECTOR | SUBCUTANEOUS | 11 refills | Status: DC

## 2020-08-29 MED ORDER — BASAGLAR KWIKPEN 100 UNIT/ML ~~LOC~~ SOPN: PEN_INJECTOR | SUBCUTANEOUS | 11 refills | Status: DC

## 2020-08-29 NOTE — Progress Notes (Unsigned)
This is a Pediatric Specialist E-Visit (My Chart Video Visit) follow up consult provided via WebEx Ethan Acevedo and Sandrea Hammond (mother) consented to an E-Visit consult today.  Location of patient: Ethan Acevedo and Ethan Acevedo (mother) is at home  Location of provider: Zachery Conch, PharmD, CPP, CDCES is at office.   S:     No chief complaint on file.   Endocrinology provider: Gretchen Short, NP (no upcoming appt)  Patient referred to me by Gretchen Short, NP, for insulin pump initiation and training. PMH significant for T1DM, goiter, and ADHD. Patient wears a t:slim X2 insulin pump and Dexcom G6 CGM. Patient was started on t:slim X2 insulin pump on 08/28/2020.   I connected with Ethan Acevedo on 09/04/20 by video and verified that I am speaking with the correct person using two identifiers.  Schedule f/u with [name] Pick up [facility] Novolog at [facility]? ([address] [facility] in [place] so make sure mom knows address) Tried Humalog?  Insurance: Medcost (PBM OptumRx)   DME Supplier: [facility]   Pump Settings   Basal Rates (Max: 2.5 units/hr) 12AM 0.95  2AM 0.85  8AM 0.95        Total: 22.2 units  Insulin to Carbohydrate Ratio (Max: 25 units) 12AM 15                Insulin Sensitivity Factor 12AM 30               Target Blood Glucose 12AM 120 (control IQ 110)                 Pump Serial Number: [id number]   Infusion Set: Autosoft XC 11mm   Infusion Set Sites -Patient-reports infusion sites are thighs --Patient reports independently doing infusion set site changes --Patient reports rotating infusion set sites  Diet: Patient reported dietary habits:  Eats 2-3 meals/day and frequently snacks/day snacks/day Breakfast (9am): K[employer] english muffin with eggs and cheese Lunch (12pm): chik fi la every day (chicken nuggets or sandwich without pickles with fries) Dinner (5:30-8:30pm): tysons chicken nuggets  -Vegetables: carrots /  cucumbers / green beans / asparagus (at least once daily) Snacks: Goldfish Mega Bites (jalapeno cheddar)  Drinks:***  Exercise: Patient-reported exercise habits: ***   Monitoring: Patient {Actions; denies-reports:120008} nocturia (nighttime urination).  Patient {Actions; denies-reports:120008} neuropathy (nerve pain). Patient {Actions; denies-reports:120008} visual changes. (***followed by ophthalmology) Patient {Actions; denies-reports:120008} self foot exams.  -Patient *** wearing socks/slippers in the house and shoes outside.  -Patient *** not currently monitoring for open wounds/cuts on her feet.   O:   Labs:   Dexcom Clarity CGM Report      TConnect Report ***  There were no vitals filed for this visit.  Lab Results  Component Value Date   HGBA1C 6.9 (A) 08/28/2020   HGBA1C 8.1 (A) 02/01/2020   HGBA1C 8.0 06/07/2019    Lab Results  Component Value Date   CPEPTIDE 0.14 (L) 01/28/2007       Component Value Date/Time   CHOL 155 06/07/2019 1127   TRIG 50 06/07/2019 1127   HDL 51 06/07/2019 1127   CHOLHDL 3.0 06/07/2019 1127   VLDL 19 04/09/2016 0001   LDLCALC 91 06/07/2019 1127    Lab Results  Component Value Date   MICRALBCREAT 10 06/07/2019    Assessment: ***  Plan: 1. Insulin pump settings:  Basal Rates (Max: 2.5 units/hr) 12AM 0.95  2AM 0.85  8AM 0.95        Total: 22.2 units  Insulin to Carbohydrate Ratio (Max: 25 units) 12AM 15                Insulin Sensitivity Factor 12AM 30               Target Blood Glucose 12AM 120 (control IQ 110)                2. Diet: 3. Exercise: 4. Monitoring:  a. Continue wearing Dexcom G6 CGM b. Ethan Acevedo has a diagnosis of diabetes, checks blood glucose readings > 4x per day, wears an insulin pump, and requires frequent adjustments to insulin regimen. This patient will be seen every six months, minimally, to assess adherence to their CGM regimen and diabetes  treatment plan. 5. Follow Up:   Written patient instructions provided.    This appointment required *** minutes of patient care (this includes precharting, chart review, review of results, virtual care, etc.).  Thank you for involving clinical pharmacist/diabetes educator to assist in providing this patient's care.  Zachery Conch, PharmD, CPP, CDCES

## 2020-09-04 ENCOUNTER — Other Ambulatory Visit: Payer: Self-pay

## 2020-09-04 ENCOUNTER — Other Ambulatory Visit (INDEPENDENT_AMBULATORY_CARE_PROVIDER_SITE_OTHER): Payer: Self-pay | Admitting: Family

## 2020-09-04 ENCOUNTER — Telehealth (INDEPENDENT_AMBULATORY_CARE_PROVIDER_SITE_OTHER): Payer: Managed Care, Other (non HMO) | Admitting: Pharmacist

## 2020-09-04 ENCOUNTER — Encounter (INDEPENDENT_AMBULATORY_CARE_PROVIDER_SITE_OTHER): Payer: Self-pay

## 2020-09-07 ENCOUNTER — Telehealth (INDEPENDENT_AMBULATORY_CARE_PROVIDER_SITE_OTHER): Payer: Self-pay | Admitting: Pharmacist

## 2020-09-07 NOTE — Telephone Encounter (Signed)
Who's calling (name and relationship to patient) : taher vannote mom   Best contact number: [phone]  Provider they see: Dr. Ladona Ridgel   Reason for call: Mom ordered pump supplies. Not all came in. Patient is in [place]. Mom would like to know if we had any cartridges, needles and syringes. Please call mom to discuss.   Call ID:      PRESCRIPTION REFILL ONLY  Name of prescription:  Pharmacy:

## 2020-09-07 NOTE — Progress Notes (Signed)
This is a Pediatric Specialist E-Visit (My Chart Video Visit) follow up consult provided via WebEx Ethan Acevedo and Sandrea Hammond (mother) consented to an E-Visit consult today.  Location of patient: Ethan Acevedo and Trey Gulbranson (mother) is at home  Location of provider: Zachery Conch, PharmD, CPP, CDCES is at office.   S:     No chief complaint on file.   Endocrinology provider: Gretchen Short, NP (no upcoming appt)  Patient referred to me by Gretchen Short, NP, for insulin pump initiation and training. PMH significant for T1DM, goiter, and ADHD. Patient wears a t:slim X2 insulin pump and Dexcom G6 CGM. Patient was started on t:slim X2 insulin pump on 08/28/2020.   I connected with Ethan Acevedo and his mother Ethan Acevedo on 09/11/20 by video and verified that I am speaking with the correct person using two identifiers. Patient has been doing well and enjoys wearing Tandem pump. Patient does admit to pain at pump site towards the end of wearing site.  Insurance: Medcost (PBM OptumRx)   DME Supplier: [facility]   Pump Settings   Basal Rates (Max: 2.5 units/hr) 12AM 0.95  2AM 0.85  8AM 0.95        Total: 22.2 units  Insulin to Carbohydrate Ratio (Max: 25 units) 12AM 15                 Insulin Sensitivity Factor 12AM 30                Target Blood Glucose 12AM 120 (control IQ 110)                 Pump Serial Number: [id number]   Infusion Set: Autosoft XC 50mm   Infusion Set Sites -Patient-reports infusion sites are thighs --Patient reports independently doing infusion set site changes --Patient reports rotating infusion set sites  Diet: Patient reported dietary habits:  Eats 2-3 meals/day and frequently snacks/day snacks/day Breakfast (9am): [employer] english muffin with eggs and cheese Lunch (12pm): chik fi la twice weekly (chicken nuggets or sandwich without pickles with fries), [place]  Futures trader (5:30-8:30pm): chicken pot pie boomerang,  tysons chicken nuggets, sandwiches with whole wheat wrap  -Vegetables: carrots / cucumbers / green beans / asparagus (at least once daily) Snacks: Goldfish Mega Bites (jalapeno cheddar)  Drinks: juice (V8 naturally sweet)   Exercise: Patient-reported exercise habits: hiking, riding bike, walking to class (1 hour minimum daily)   Monitoring: Patient reports nocturia (nighttime urination) about once weekly Patient denies neuropathy (nerve pain). Patient denies visual changes. (Followed by ophthalmology, Dr. Lavell Islam Ophthalmology last seen 06/2020) -He stated he saw "the smallest bit of retinopathy"  Patient reports self foot exams; no open cuts/wounds   O:   Labs:   Dexcom Clarity CGM Report     TConnect Report   There were no vitals filed for this visit.  Lab Results  Component Value Date   HGBA1C 6.9 (A) 08/28/2020   HGBA1C 8.1 (A) 02/01/2020   HGBA1C 8.0 06/07/2019    Lab Results  Component Value Date   CPEPTIDE 0.14 (L) 01/28/2007       Component Value Date/Time   CHOL 155 06/07/2019 1127   TRIG 50 06/07/2019 1127   HDL 51 06/07/2019 1127   CHOLHDL 3.0 06/07/2019 1127   VLDL 19 04/09/2016 0001   LDLCALC 91 06/07/2019 1127    Lab Results  Component Value Date   MICRALBCREAT 10 06/07/2019    Assessment: DM management - TIR is  not at goal > 70%, however, is improving. Encouraged patient for TIR increase from 35% --> 44%. Seldom hypoglycemia, which occurs after control IQ auto bolus at night or he will ghost bolus after eating; based on this will change ICR 15 --> 12 and will change ISF 30 --> 40 at night. Will also increase basal rates as pump is providing additional insulin (reflective in TConnect report). Follow up in 2 weeks. Also advised patient to schedule f/u with Gretchen Short, NP; mom states she will schedule this for May 2022 as he will be home from college.  Pump education - Patient is experiencing pain at pump site towards the end of  wearing his pump. Discussed that he typically is wearing pump sites too long (4-5 days). Advised him to change site every 3 days and see if that helps. He is agreeable.  Plan: 1. Insulin pump settings:  Basal Rates (Max: 2.5 units/hr) 12AM 0.95 --> 1.05  2AM 0.85 --> 0.95  8AM 0.95 --> 1.05        Total: 22.2 units --> 24.6  Insulin to Carbohydrate Ratio (Max: 25 units) 12AM 15 --> 12                Insulin Sensitivity Factor 12AM - 8AM 40  8AM - 11PM 30  11PM -12AM 40         Target Blood Glucose 12AM 120 (control IQ 110)                2. Pump education a. Discussed changing pump site every 3 days 3. Monitoring:  a. Continue wearing Dexcom G6 CGM b. Ethan Acevedo has a diagnosis of diabetes, checks blood glucose readings > 4x per day, wears an insulin pump, and requires frequent adjustments to insulin regimen. This patient will be seen every six months, minimally, to assess adherence to their CGM regimen and diabetes treatment plan. 4. Follow Up: 2 weeks a. Will f/u with Gretchen Short, NP in May 2022  Written patient instructions provided.    This appointment required 45 minutes of patient care (this includes precharting, chart review, review of results, virtual care, etc.).  Thank you for involving clinical pharmacist/diabetes educator to assist in providing this patient's care.  Zachery Conch, PharmD, CPP, CDCES

## 2020-09-07 NOTE — Telephone Encounter (Signed)
Called mom to let her know we do have some and I will put them up front after Dr. Ladona Ridgel completes her current patient visit after 10:30.  Mom was appreciative.

## 2020-09-11 ENCOUNTER — Other Ambulatory Visit: Payer: Self-pay

## 2020-09-11 ENCOUNTER — Telehealth (INDEPENDENT_AMBULATORY_CARE_PROVIDER_SITE_OTHER): Payer: Managed Care, Other (non HMO) | Admitting: Pharmacist

## 2020-09-11 DIAGNOSIS — E1065 Type 1 diabetes mellitus with hyperglycemia: Secondary | ICD-10-CM

## 2020-09-16 NOTE — Progress Notes (Signed)
This is a Pediatric Specialist E-Visit (My Chart Video Visit) follow up consult provided via WebEx [name] and Sandrea Hammond (mother) consented to an E-Visit consult today.  Location of patient: Ethan Acevedo and Lyan Moyano (mother) is at home  Location of provider: Zachery Conch, PharmD, CPP, CDCES is at office.   S:     Chief Complaint  Patient presents with  . Diabetes    Pump Follow Up    Endocrinology provider: Gretchen Short, NP (no upcoming appt)  Patient referred to me by Gretchen Short, NP, for insulin pump initiation and training. PMH significant for T1DM, goiter, and ADHD. Patient wears a t:slim X2 insulin pump and Dexcom G6 CGM. Patient was started on t:slim X2 insulin pump on 08/28/2020.   At prior appt with myself on 09/11/20, TIR was not at goal > 70%, however, was improving. Encouraged patient for TIR increase from 35% --> 44%. Seldom hypoglycemia, which occurred after control IQ auto bolus at night or he will ghost bolus after eating; based on this changed ICR 15 --> 12 and changed ISF 30 --> 40 at night. Increased basal rates as pump is providing additional insulin (reflective in TConnect report)  I connected with [name] on 09/25/20 by video and verified that I am speaking with the correct person using two identifiers. His mother is unable to join visit today. Patient has been doing well and feels that he has more energy. He has noticed his BG readings are more within range 70-180 mg/dL.   Insurance: Medcost (PBM OptumRx)   DME Supplier: [facility]   Pump Settings   Basal Rates (Max: 2.5 units/hr) 12a-2a 1.05  2a-8a 0.95  8a-12a 1.05        Total: 24.6 units  Insulin to Carbohydrate Ratio (Max: 25 units) 12a-8a 12  8a-11p 15  11p-12a 12          Insulin Sensitivity Factor 12a-8a 40   8a-11p 30  11p-12a 40         Target Blood Glucose 12a-12a 120 (control IQ 110)                 Pump Serial Number: [id number]    Infusion Set: Autosoft XC 59mm   Infusion Set Sites -Patient reports infusion sites are thighs --Patient reports independently doing infusion set site changes --Patient reports rotating infusion set sites  Diet (no changes since prior appt on 09/11/20) Patient reported dietary habits:  Eats 2-3 meals/day and frequently snacks/day  Breakfast (9am): Killer Dave's english muffin with eggs and cheese Lunch (12pm): chik fi la twice weekly (chicken nuggets or sandwich without pickles with fries), [place]  Futures trader (5:30-8:30pm): chicken pot pie boomerang, [employer]ys[employer] chicken nuggets, sandwiches with whole wheat wrap  -Vegetables: carrots / cucumbers / green beans / asparagus (at least once daily) Snacks: Goldfish Mega Bites (jalapeno cheddar)  Drinks: juice (V8 naturally sweet)   Exercise (no changes since prior appt on 09/11/20) Patient-reported exercise habits: hiking, riding bike, walking to class (1 hour minimum daily)   Monitoring: Patient denies nocturia (nighttime urination) about once weekly Patient denies neuropathy (nerve pain). Patient denies visual changes. (Followed by ophthalmology, Dr. Lavell Islam Ophthalmology last seen 06/2020) -He stated he saw "the smallest bit of retinopathy"  Patient reports self foot exams; no open cuts/wounds   O:   Labs:   Dexcom Clarity CGM Report     TConnect Report   There were no vitals filed for this visit.  Lab Results  Component Value Date   HGBA1C 6.9 (A) 08/28/2020   HGBA1C 8.1 (A) 02/01/2020   HGBA1C 8.0 06/07/2019    Lab Results  Component Value Date   CPEPTIDE 0.14 (L) 01/28/2007       Component Value Date/Time   CHOL 155 06/07/2019 1127   TRIG 50 06/07/2019 1127   HDL 51 06/07/2019 1127   CHOLHDL 3.0 06/07/2019 1127   VLDL 19 04/09/2016 0001   LDLCALC 91 06/07/2019 1127    Lab Results  Component Value Date   MICRALBCREAT 10 06/07/2019    Assessment: DM management - TIR is extremely close to goal > 70%.  Very minimal hypoglycemia that does not occur as a pattern. It is important to note patient's TIR has increased from 44% --> 65%. Encouraged patient for his success!!! Also encouraged patient for changing sites every 3.5 days --> 3 days. Most noticeable trend is elevated post-prandial hyperglycemia that tends to occur the most after 2PM per pump report. Will adjust ICR from 15 --> 12 at that time to increase insulin with meals. Continue to wear Dexcom G6 CGM. Will f/u in 2 weeks.   Plan: 1. Insulin pump settings:  Insulin to Carbohydrate Ratio (Max: 25 units) 12a-8a 12  8a-2p 15  2p-12a 15 --> 12         2. Monitoring:  a. Continue wearing Dexcom G6 CGM b. [name] has a diagnosis of diabetes, checks blood glucose readings > 4x per day, wears an insulin pump, and requires frequent adjustments to insulin regimen. This patient will be seen every six months, minimally, to assess adherence to their CGM regimen and diabetes treatment plan. 3. Follow Up: 2 weeks  This appointment required 45 minutes of patient care (this includes precharting, chart review, review of results, virtual care, etc.).  Thank you for involving clinical pharmacist/diabetes educator to assist in providing this patient's care.  Zachery Conch, PharmD, CPP, CDCES

## 2020-09-25 ENCOUNTER — Telehealth (INDEPENDENT_AMBULATORY_CARE_PROVIDER_SITE_OTHER): Payer: Managed Care, Other (non HMO) | Admitting: Pharmacist

## 2020-09-25 ENCOUNTER — Other Ambulatory Visit: Payer: Self-pay

## 2020-09-25 DIAGNOSIS — E1065 Type 1 diabetes mellitus with hyperglycemia: Secondary | ICD-10-CM

## 2020-09-27 NOTE — Progress Notes (Deleted)
This is a Pediatric Specialist E-Visit (My Chart Video Visit) follow up consult provided via WebEx [name] and Sandrea Hammond (mother) consented to an E-Visit consult today.  Location of patient: Ethan Acevedo and Ethan Acevedo (mother) is at home  Location of provider: Zachery Conch, PharmD, CPP, CDCES is at office.   S:     No chief complaint on file.   Endocrinology provider: Gretchen Short, NP (no upcoming appt)  Patient referred to me by Gretchen Short, NP, for insulin pump initiation and training. PMH significant for T1DM, goiter, and ADHD. Patient wears a t:slim X2 insulin pump and Dexcom G6 CGM. Patient was started on t:slim X2 insulin pump on 08/28/2020.   At prior appt with myself on 09/25/20, patient's TIR was 65%, 1% low, and <1% very low.  Very minimal hypoglycemia that does not occur as a pattern. It was important to note patient's TIR has increased from 44% --> 65%. Encouraged patient for his success!!! Also encouraged patient for changing sites every 3.5 days --> 3 days. Most noticeable trend was elevated post-prandial hyperglycemia that tended to occur the most after 2PM per pump report. Adjust ICR from 15 --> 12 at that time to increase insulin with meals.   I connected with [name] on 10/09/20 by video and verified that I am speaking with the correct person using two identifiers. ***  Insurance: Medcost (PBM OptumRx)   DME Supplier: [facility]   Pump Settings   Basal Rates (Max: 2.5 units/hr) 12a-2a 1.05  2a-8a 0.95  8a-12a 1.05        Total: 24.6 units  Insulin to Carbohydrate Ratio (Max: 25 units) 12a-12a 12                Insulin Sensitivity Factor 12a-8a 40   8a-11p 30  11p-12a 40         Target Blood Glucose 12a-12a 120 (control IQ 110)                 Pump Serial Number: [id number]   Infusion Set: Autosoft XC 61mm   Infusion Set Sites -Patient reports infusion sites are thighs --Patient reports independently doing  infusion set site changes --Patient reports rotating infusion set sites  Diet (*** changes since prior appt on 09/25/20) Patient reported dietary habits:  Eats 2-3 meals/day and frequently snacks/day  Breakfast (9am): Killer Dave's english muffin with eggs and cheese Lunch (12pm): chik fi la twice weekly (chicken nuggets or sandwich without pickles with fries), [place]  Futures trader (5:30-8:30pm): chicken pot pie boomerang, tys[employer] chicken nuggets, sandwiches with whole wheat wrap  -Vegetables: carrots / cucumbers / green beans / asparagus (at least once daily) Snacks: Goldfish Mega Bites (jalapeno cheddar)  Drinks: juice (V8 naturally sweet)   Exercise (*** changes since prior appt on 09/25/20) Patient-reported exercise habits: hiking, riding bike, walking to class (1 hour minimum daily)   Monitoring: Patient *** nocturia (nighttime urination) about once weekly Patient *** neuropathy (nerve pain). Patient *** visual changes. (Followed by ophthalmology, Dr. Lavell Islam Ophthalmology last seen 06/2020) -He stated he saw "the smallest bit of retinopathy"  Patient *** self foot exams; no open cuts/wounds   O:   Labs:   Dexcom Clarity CGM Report  ***  TConnect Report ***  There were no vitals filed for this visit.  Lab Results  Component Value Date   HGBA1C 6.9 (A) 08/28/2020   HGBA1C 8.1 (A) 02/01/2020   HGBA1C 8.0 06/07/2019    Lab Results  Component Value Date   CPEPTIDE 0.14 (L) 01/28/2007       Component Value Date/Time   CHOL 155 06/07/2019 1127   TRIG 50 06/07/2019 1127   HDL 51 06/07/2019 1127   CHOLHDL 3.0 06/07/2019 1127   VLDL 19 04/09/2016 0001   LDLCALC 91 06/07/2019 1127    Lab Results  Component Value Date   MICRALBCREAT 10 06/07/2019    Assessment: DM management - TIR is *** to goal > 70%. *** hypoglycemia.   Plan: 1. *** insulin pump settings: 2. Monitoring:  a. Continue wearing Dexcom G6 CGM b. [name] has a diagnosis of diabetes,  checks blood glucose readings > 4x per day, wears an insulin pump, and requires frequent adjustments to insulin regimen. This patient will be seen every six months, minimally, to assess adherence to their CGM regimen and diabetes treatment plan. 3. Follow Up: ***  This appointment required *** minutes of patient care (this includes precharting, chart review, review of results, virtual care, etc.).  Thank you for involving clinical pharmacist/diabetes educator to assist in providing this patient's care.  Zachery Conch, PharmD, CPP, CDCES

## 2020-10-05 ENCOUNTER — Other Ambulatory Visit (INDEPENDENT_AMBULATORY_CARE_PROVIDER_SITE_OTHER): Payer: Self-pay | Admitting: Family

## 2020-10-05 DIAGNOSIS — IMO0002 Reserved for concepts with insufficient information to code with codable children: Secondary | ICD-10-CM

## 2020-10-05 DIAGNOSIS — E1065 Type 1 diabetes mellitus with hyperglycemia: Secondary | ICD-10-CM

## 2020-10-08 ENCOUNTER — Other Ambulatory Visit (INDEPENDENT_AMBULATORY_CARE_PROVIDER_SITE_OTHER): Payer: Self-pay | Admitting: Family

## 2020-10-08 DIAGNOSIS — IMO0002 Reserved for concepts with insufficient information to code with codable children: Secondary | ICD-10-CM

## 2020-10-08 DIAGNOSIS — E1065 Type 1 diabetes mellitus with hyperglycemia: Secondary | ICD-10-CM

## 2020-10-09 ENCOUNTER — Telehealth (INDEPENDENT_AMBULATORY_CARE_PROVIDER_SITE_OTHER): Payer: Self-pay | Admitting: Pharmacist

## 2020-10-09 ENCOUNTER — Telehealth (INDEPENDENT_AMBULATORY_CARE_PROVIDER_SITE_OTHER): Payer: Self-pay | Admitting: Family

## 2020-10-09 NOTE — Telephone Encounter (Signed)
  Who's calling (name and relationship to patient) : Tobi Bastos, mother (DPR on file to speak with her)  Best contact number: [phone]  Provider they see: Gretchen Short, NP  Reason for call: Has questions regarding the blood glucose correction dosing on pump.      PRESCRIPTION REFILL ONLY  Name of prescription:  Pharmacy:

## 2020-10-10 ENCOUNTER — Telehealth (INDEPENDENT_AMBULATORY_CARE_PROVIDER_SITE_OTHER): Payer: Self-pay | Admitting: Family

## 2020-10-10 ENCOUNTER — Other Ambulatory Visit (INDEPENDENT_AMBULATORY_CARE_PROVIDER_SITE_OTHER): Payer: Self-pay | Admitting: Family

## 2020-10-10 MED ORDER — INSULIN ASPART 100 UNIT/ML ~~LOC~~ SOLN: SUBCUTANEOUS | 0 refills | Status: DC

## 2020-10-10 NOTE — Telephone Encounter (Signed)
Spoke with mom. She called in regards to the insulin, Novolog. She said that his supplies as far as Dexcom are fine. She said that she doesn't feel safe with Ethan Acevedo using Humalog or any other insulin besides Novolog and name brand Novolog. She said that the humalog is taking to long to work. He stays high, they are changing sites and its not helping. She said that the Humalog is irritating his skin at the sites. I made mom aware that her insurance doesn't cover the Novolog and that Dr Ladona Ridgel sent in [place] Relion to [facility] in March. Ethan Acevedo is aware of this but doesn't want that insulin. I explained to her that it Novolog, just [facility] brand and is cheaper. She still didn't want this and asked that name brand Novolog be sent to [facility] and [facility]. I did as mom asked.

## 2020-10-10 NOTE — Telephone Encounter (Signed)
  Who's calling (name and relationship to patient) : Tobi Bastos ( mom)  Best contact number: [phone]  Provider they see: Gretchen Short   Reason for call: mom called to speak to someone  they have asked for a refill of the patients diabetic supplies and the pharmacy is telling mom that we have rejected that request. She does like it sent to two pharmacies and she is aware that insurance may not cove the brand she wants but she pays out of pocket for the medication if need be. Patient has upcoming appts scheduled. Mom absolutely does not want to use Humalog      PRESCRIPTION REFILL ONLY  Name of prescription: Dexcom   Pharmacy: [address]   [facility] [address] [place]

## 2020-10-10 NOTE — Telephone Encounter (Signed)
Mom [facility]. [facility].

## 2020-10-12 NOTE — Progress Notes (Deleted)
This is a Pediatric Specialist E-Visit (My Chart Video Visit) follow up consult provided via WebEx Ethan Acevedo and Ethan Acevedo (mother) consented to an E-Visit consult today.  Location of patient: Ethan Acevedo and Ethan Acevedo (mother) is at home  Location of provider: Zachery Conch, PharmD, CPP, CDCES is at office.   S:     No chief complaint on file.   Endocrinology provider: Gretchen Short, NP (no upcoming appt)  Patient referred to me by Gretchen Short, NP, for insulin pump initiation and training. PMH significant for T1DM, goiter, and ADHD. Patient wears a t:slim X2 insulin pump and Dexcom G6 CGM. Patient was started on t:slim X2 insulin pump on 08/28/2020.   At prior appt with myself on 09/25/20, patient's TIR was 65%, 1% low, and <1% very low.  Very minimal hypoglycemia that does not occur as a pattern. It was important to note patient's TIR has increased from 44% --> 65%. Encouraged patient for his success!!! Also encouraged patient for changing sites every 3.5 days --> 3 days. Most noticeable trend was elevated post-prandial hyperglycemia that tended to occur the most after 2PM per pump report. Adjust ICR from 15 --> 12 at that time to increase insulin with meals.   I connected with Ethan Acevedo on 10/16/20 by video and verified that I am speaking with the correct person using two identifiers. ***  Insurance: Medcost (PBM [employer]Rx)   DME Supplier: [facility]   Pump Settings   Basal Rates (Max: 2.5 units/hr) 12a-2a 1.05  2a-8a 0.95  8a-12a 1.05        Total: 24.6 units  Insulin to Carbohydrate Ratio (Max: 25 units) 12a-12a 12                Insulin Sensitivity Factor 12a-8a 40   8a-11p 30  11p-12a 40         Target Blood Glucose 12a-12a 120 (control IQ 110)                 Pump Serial Number: [id number]   Infusion Set: Autosoft XC 33mm   Infusion Set Sites -Patient reports infusion sites are thighs --Patient reports independently doing  infusion set site changes --Patient reports rotating infusion set sites  Diet (*** changes since prior appt on 09/25/20) Patient reported dietary habits:  Eats 2-3 meals/day and frequently snacks/day  Breakfast (9am): [employer] english muffin with eggs and cheese Lunch (12pm): chik fi la twice weekly (chicken nuggets or sandwich without pickles with fries), [place]  Futures trader (5:30-8:30pm): chicken pot pie boomerang, tysons chicken nuggets, sandwiches with whole wheat wrap  -Vegetables: carrots / cucumbers / green beans / asparagus (at least once daily) Snacks: Goldfish Mega Bites (jalapeno cheddar)  Drinks: juice (V8 naturally sweet)   Exercise (*** changes since prior appt on 09/25/20) Patient-reported exercise habits: hiking, riding bike, walking to class (1 hour minimum daily)   Monitoring: Patient *** nocturia (nighttime urination) about once weekly Patient *** neuropathy (nerve pain). Patient *** visual changes. (Followed by ophthalmology, Dr. Lavell Islam Ophthalmology last seen 06/2020) -He stated he saw "the smallest bit of retinopathy"  Patient *** self foot exams; no open cuts/wounds   O:   Labs:   Dexcom Clarity CGM Report  ***  TConnect Report ***  There were no vitals filed for this visit.  Lab Results  Component Value Date   HGBA1C 6.9 (A) 08/28/2020   HGBA1C 8.1 (A) 02/01/2020   HGBA1C 8.0 06/07/2019    Lab Results  Component Value Date   CPEPTIDE 0.14 (L) 01/28/2007       Component Value Date/Time   CHOL 155 06/07/2019 1127   TRIG 50 06/07/2019 1127   HDL 51 06/07/2019 1127   CHOLHDL 3.0 06/07/2019 1127   VLDL 19 04/09/2016 0001   LDLCALC 91 06/07/2019 1127    Lab Results  Component Value Date   MICRALBCREAT 10 06/07/2019    Assessment: DM management - TIR is *** to goal > 70%. *** hypoglycemia.   Plan: 1. *** insulin pump settings: 2. Monitoring:  a. Continue wearing Dexcom G6 CGM b. Ethan Acevedo has a diagnosis of diabetes,  checks blood glucose readings > 4x per day, wears an insulin pump, and requires frequent adjustments to insulin regimen. This patient will be seen every six months, minimally, to assess adherence to their CGM regimen and diabetes treatment plan. 3. Follow Up: ***  This appointment required *** minutes of patient care (this includes precharting, chart review, review of results, virtual care, etc.).  Thank you for involving clinical pharmacist/diabetes educator to assist in providing this patient's care.  Zachery Conch, PharmD, CPP, CDCES

## 2020-10-16 ENCOUNTER — Telehealth (INDEPENDENT_AMBULATORY_CARE_PROVIDER_SITE_OTHER): Payer: Self-pay | Admitting: Pharmacist

## 2020-11-05 ENCOUNTER — Telehealth (INDEPENDENT_AMBULATORY_CARE_PROVIDER_SITE_OTHER): Payer: Self-pay | Admitting: Family

## 2020-11-05 NOTE — Telephone Encounter (Signed)
  Who's calling (name and relationship to patient) :MOM/ Melissa Montane contact number:[phone]  Provider they CJA:RWPTYYP Beasely   Reason for call:mom called to see if any way possible she cold come up to get a piece of equipment needed for her sons transmitter. She is unsure of what it is called. Please advise mom.      PRESCRIPTION REFILL ONLY  Name of prescription:  Pharmacy:

## 2020-11-05 NOTE — Telephone Encounter (Signed)
Mom came to pick up infusion sets.

## 2020-11-06 ENCOUNTER — Ambulatory Visit (INDEPENDENT_AMBULATORY_CARE_PROVIDER_SITE_OTHER): Payer: Managed Care, Other (non HMO) | Admitting: Family

## 2020-11-20 ENCOUNTER — Telehealth (INDEPENDENT_AMBULATORY_CARE_PROVIDER_SITE_OTHER): Payer: Self-pay | Admitting: Family

## 2020-11-20 NOTE — Telephone Encounter (Signed)
  Who's calling (name and relationship to patient) :Lithuania (Mother)   Best contact number:[phone] (M)   Provider they IRJ:JOACZYS, Ovidio Kin, NP   Reason for call: Please give call to mom ASAP to discuss his prescription increase. Oswaldo is out cartridge reservoir     PRESCRIPTION REFILL ONLY  Name of prescription:  Pharmacy:

## 2020-11-20 NOTE — Telephone Encounter (Signed)
Called Anna back. LVM with call back number.

## 2020-11-21 NOTE — Telephone Encounter (Signed)
Called Anna LVM with call back number.

## 2020-11-23 NOTE — Telephone Encounter (Signed)
Called again. LVM with call back number.

## 2020-12-18 ENCOUNTER — Ambulatory Visit (INDEPENDENT_AMBULATORY_CARE_PROVIDER_SITE_OTHER): Payer: Managed Care, Other (non HMO) | Admitting: Family

## 2021-01-29 ENCOUNTER — Encounter (INDEPENDENT_AMBULATORY_CARE_PROVIDER_SITE_OTHER): Payer: Self-pay | Admitting: Family

## 2021-01-29 ENCOUNTER — Ambulatory Visit (INDEPENDENT_AMBULATORY_CARE_PROVIDER_SITE_OTHER): Payer: Managed Care, Other (non HMO) | Admitting: Family

## 2021-01-29 ENCOUNTER — Other Ambulatory Visit: Payer: Self-pay

## 2021-01-29 VITALS — BP 118/74 | HR 76 | Wt 143.2 lb

## 2021-01-29 DIAGNOSIS — E1065 Type 1 diabetes mellitus with hyperglycemia: Secondary | ICD-10-CM

## 2021-01-29 DIAGNOSIS — Z9641 Presence of insulin pump (external) (internal): Secondary | ICD-10-CM

## 2021-01-29 LAB — POCT GLUCOSE (DEVICE FOR HOME USE): POC Glucose: 198 mg/dl — AB (ref 70–99)

## 2021-01-29 LAB — POCT GLYCOSYLATED HEMOGLOBIN (HGB A1C): Hemoglobin A1C: 6 % — AB (ref 4.0–5.6)

## 2021-01-29 NOTE — Progress Notes (Signed)
Pediatric Endocrinology Diabetes Consultation Follow-up Visit  Ethan Acevedo 2000/06/30 [id number]  Chief Complaint: Follow-up type 1 diabetes   [name], MD   HPI: Ethan Acevedo  is a 20 y.o. male presenting for follow-up of type 1 diabetes. he is accompanied to this visit by his mother.   1. Ethan Acevedo was admitted to pediatric ward of the [facility] on 01/28/07 for new onset type 1 diabetes mellitus, mild-moderate diabetic ketoacidosis, dehydration, and ketonuria. He was 32-1/20 years old. He had about a 3-week history of polyuria, polydipsia, and nocturia, and a one-week history of unusual enuresis. The parents brought the child to his pediatrician, Dr. [name]. Dr. [name] checked his BG. The BG was "high" (greater than 500). Urinalysis showed 3+ glucose and 3+ ketones. We started him on Lantus as a basal insulin at bedtime and Novolog aspart as a bolus insulin at meals and at bedtime and 2 AM as needed. On  12/10/07 we converted him to a Medtronic 722 insulin pump.    2. Since last visit to [facility] on 01/2020, he has been well.  No ER visits or hospitalizations.   He is working at [employer]. Will start back at school on August 14th.   Using Tslim insulin pump which is working well for him. He has made changes to diet but decreasing his carb intake and eating healthier carbs. He feels diet changes have been very helpful. He usually boluses before eating or as he is eating.   Concerns:  - Blood sugars running high with lifting weights.  - Currently buying Novolog out of pocket. Insurance covers humalog but he does not feel like it works that well. Multiple appeals and PA have been filed over the past year with insurance.     Insulin regimen: Medtronic 670g Basal Rates 12AM 1.15  4am 1.20  8am 1.25  1pm 1.35       Insulin to Carbohydrate Ratio 12AM 10  6am 6  11am 7  11pm 9       Insulin Sensitivity Factor 12AM 40  6am 35  11pm 40         Target Blood  Glucose 12AM 150  6am 120  9pm 150           Hypoglycemia: Able to feel low blood sugars.  No glucagon needed recently.  Insulin Pump download    Med-alert ID: Not currently wearing.  Injection sites: Legs.  Annual labs due: 05/2020--> 0rdered  Ophthalmology due: 2020     3. ROS: Greater than 10 systems reviewed with pertinent positives listed in HPI, otherwise neg. Constitutional: Sleeping well. 15 lbs weight loss (intentional with diet changes, more exercise)  Eyes: No changes in vision. . No blurry vision  Ears/Nose/Mouth/Throat: No difficulty swallowing. Cardiovascular: No palpitations. Denies chest pain  Respiratory: No increased work of breathing Gastrointestinal: No constipation or diarrhea. No abdominal pain Genitourinary: No nocturia, no polyuria Musculoskeletal: No joint pain Neurologic: Normal sensation, no tremor Endocrine: No polydipsia.  No hyperpigmentation Psychiatric: Normal affect  Past Medical History:   Past Medical History:  Diagnosis Date   ADHD (attention deficit hyperactivity disorder)    Diabetic ketoacidosis, type I ([place])    Goiter    Hypoglycemia associated with diabetes ([place])    Physical growth delay    Type 1 diabetes mellitus in patient age 62-12 years with HbA1C goal below 8     Medications:  Outpatient Encounter Medications as of 01/29/2021  Medication Sig  Blood Glucose Monitoring Suppl (CONTOUR NEXT EZ) w/Device KIT 1 kit by Does not apply route daily as needed.   Blood Glucose Monitoring Suppl (CONTOUR NEXT MONITOR) w/Device KIT Please specify directions, refills and quantity   Continuous Blood Gluc Sensor (DEXCOM G6 SENSOR) MISC APPLY 1 SENSOR EVERY 10 DAYS   Continuous Blood Gluc Transmit (DEXCOM G6 TRANSMITTER) MISC 1 each by Does not apply route every 3 (three) months.   Glucagon (BAQSIMI TWO PACK) 3 MG/DOSE POWD Place 1 kit into the nose as needed (for severe Hypoglycemia that he cannot eat ot drink).   glucose blood (BAYER  CONTOUR NEXT TEST) test strip Check BG 6x day   insulin aspart (NOVOLOG FLEXPEN RELION) 100 UNIT/ML FlexPen Inject up to 50 units per day per provider guidance. Please fill for Novolog Relion PENS   insulin aspart (NOVOLOG RELION) 100 UNIT/ML injection Administer up to 300 units into pump every 3 days; please fill for Novolog Relion VIAL   insulin aspart (NOVOLOG) 100 UNIT/ML injection Use 300 units in pump every 48 hours.   Insulin Glargine (BASAGLAR KWIKPEN) 100 UNIT/ML Inject up to 50 units per day per provider instructions   insulin lispro (HUMALOG) 100 UNIT/ML injection USE 300 UNITS IN INSULIN PUMP EVERY 48 HOURS   Lancets Misc. (ACCU-CHEK MULTICLIX LANCET DEV) KIT USE AS DIRECTED   NOVOLOG 100 UNIT/ML injection USE 300 UNITS VIA INSULIN PUMP EVERY 48 HOURS   Urine Glucose-Ketones Test STRP Use to check urine in cases of hyperglycemia   No facility-administered encounter medications on file as of 01/29/2021.    Allergies: Allergies  Allergen Reactions   Humalog [Insulin Lispro] Shortness Of Breath, Diarrhea and Other (See Comments)    Headaches-     Surgical History: No past surgical history on file.  Family History:  Family History  Problem Relation Age of Onset   Thyroid disease Mother        Papillary thyroid cancer   Cancer Mother      Social History: Lives with: Mother, father and older brother  Levora Dredge at [employer]     Physical Exam:  Vitals:   01/29/21 1043  BP: 118/74  Pulse: 76  Weight: 143 lb 3.2 oz (65 kg)   BP 118/74 (BP Location: Right Arm, Patient Position: Sitting, Cuff Size: Normal)   Pulse 76   Wt 143 lb 3.2 oz (65 kg)   BMI 21.19 kg/m  Body mass index: body mass index is 21.19 kg/m. Growth percentile SmartLinks can only be used for patients less than 71 years old.    Ht Readings from Last 3 Encounters:  08/28/20 5' 8.94" (1.751 m)  02/01/20 _0  (1.753 m) (42 %, Z= -0.21)*  06/07/19 5' 9.06" (1.754 m) (44 %, Z= -0.16)*   *  Growth percentiles are based on CDC (Boys, 2-20 Years) data.   Wt Readings from Last 3 Encounters:  01/29/21 143 lb 3.2 oz (65 kg)  08/28/20 159 lb 12.8 oz (72.5 kg)  02/01/20 165 lb 6.4 oz (75 kg) (67 %, Z= 0.43)*   * Growth percentiles are based on CDC (Boys, 2-20 Years) data.   General: Well developed, well nourished male in no acute distress.   Head: Normocephalic, atraumatic.   Eyes:  Pupils equal and round. EOMI.  Sclera white.  No eye drainage.   Ears/Nose/Mouth/Throat: Nares patent, no nasal drainage.  Normal dentition, mucous membranes moist.  Neck: supple, no cervical lymphadenopathy, no thyromegaly Cardiovascular: regular rate, normal S1/S2, no murmurs Respiratory: No increased  work of breathing.  Lungs clear to auscultation bilaterally.  No wheezes. Abdomen: soft, nontender, nondistended. Normal bowel sounds.  No appreciable masses  Extremities: warm, well perfused, cap refill < 2 sec.   Musculoskeletal: Normal muscle mass.  Normal strength Skin: warm, dry.  No rash or lesions. Neurologic: alert and oriented, normal speech, no tremor    Labs: Last hemoglobin A1c:6.9% on 08/2020  Results for orders placed or performed in visit on 01/29/21  POCT glycosylated hemoglobin (Hb A1C)  Result Value Ref Range   Hemoglobin A1C 6.0 (A) 4.0 - 5.6 %   HbA1c POC (<> result, manual entry)     HbA1c, POC (prediabetic range)     HbA1c, POC (controlled diabetic range)    POCT Glucose (Device for Home Use)  Result Value Ref Range   Glucose Fasting, POC     POC Glucose 198 (A) 70 - 99 mg/dl    Results for orders placed or performed in visit on 01/29/21  POCT glycosylated hemoglobin (Hb A1C)  Result Value Ref Range   Hemoglobin A1C 6.0 (A) 4.0 - 5.6 %   HbA1c POC (<> result, manual entry)     HbA1c, POC (prediabetic range)     HbA1c, POC (controlled diabetic range)    POCT Glucose (Device for Home Use)  Result Value Ref Range   Glucose Fasting, POC     POC Glucose 198 (A) 70 -  99 mg/dl    Assessment/Plan: Ethan Acevedo is a 20 y.o. male with uncontrolled type 1 diabetes on insulin pump and CGM therapy. Diabetes care and glucose control has improved with more consistent bolusing and dietary changes. His hemoglobin A1c has decreased to 6% today.    1. DM w/o complication type I, uncontrolled (HCC)/ 2. Hyperglycemia/ 3 hypoglycemia/ 4. Elevated hemoglobin A1c  - Reviewed insulin pump and CGM download. Discussed trends and patterns.  - Rotate pump sites to prevent scar tissue.  - bolus 15 minutes prior to eating to limit blood sugar spikes.  - Reviewed carb counting and importance of accurate carb counting.  - Discussed signs and symptoms of hypoglycemia. Always have glucose available.  - POCT glucose and hemoglobin A1c  - Reviewed growth chart.  - Discussed activity with diabetes   - If weight lifting--> blood sugars tend to run higher> ok to bolus before and during activity   - For cardio--> start activity mode 30 minutes prior to activity. If under 150 eat 15 grams of carbs.   5. Insulin pump titration/insulin pump in place.   - No changes today Pump in place     Follow-up:   3 months. Send mychart message as needed for updates.   Medical decision-making:  >45 spent today reviewing the medical chart, counseling the patient/family, and documenting today's visit.    When a patient is on insulin, intensive monitoring of blood glucose levels is necessary to avoid hyperglycemia and hypoglycemia. Severe hyperglycemia/hypoglycemia can lead to hospital admissions and be life threatening.    Hermenia Bers,  FNP-C  Pediatric Specialist  [address] [place]  [place], [address]  Tele: [phone]

## 2021-01-29 NOTE — Patient Instructions (Signed)
It was a pleasure seeing you in clinic today. Please do not hesitate to contact me if you have questions or concerns.   Please sign up for MyChart. This is a communication tool that allows you to send an email directly to me. This can be used for questions, prescriptions and blood sugar reports. We will also release labs to you with instructions on MyChart. Please do not use MyChart if you need immediate or emergency assistance. Ask our wonderful front office staff if you need assistance.    At [facility], we are committed to providing exceptional care. You will receive a patient satisfaction survey through text or email regarding your visit today. Your opinion is important to me. Comments are appreciated.  

## 2021-01-30 ENCOUNTER — Other Ambulatory Visit (INDEPENDENT_AMBULATORY_CARE_PROVIDER_SITE_OTHER): Payer: Self-pay | Admitting: Family

## 2021-01-30 LAB — TSH: TSH: 1.48 mIU/L (ref 0.40–4.50)

## 2021-01-30 LAB — LIPID PANEL
Cholesterol: 152 mg/dL (ref <200)
HDL: 46 mg/dL (ref >=40)
LDL Cholesterol (Calc): 91 mg/dL (calc)
Non-HDL Cholesterol (Calc): 106 mg/dL (calc) (ref <130)
Total CHOL/HDL Ratio: 3.3 (calc) (ref <5.0)
Triglycerides: 67 mg/dL (ref <150)

## 2021-01-30 LAB — MICROALBUMIN / CREATININE URINE RATIO
Creatinine, Urine: 207 mg/dL (ref 20–320)
Microalb Creat Ratio: 4 mcg/mg creat (ref <30)
Microalb, Ur: 0.8 mg/dL

## 2021-01-30 LAB — T4, FREE: Free T4: 1.3 ng/dL (ref 0.8–1.4)

## 2021-02-06 ENCOUNTER — Telehealth (INDEPENDENT_AMBULATORY_CARE_PROVIDER_SITE_OTHER): Payer: Self-pay | Admitting: Family

## 2021-02-06 NOTE — Telephone Encounter (Signed)
Who's calling (name and relationship to patient) : Ethan Acevedo mom   Best contact number: [phone]  Provider they see: Gretchen Short  Reason for call: Dexcom site wasn't working at last appt. Mom uploaded the information that was missing. Mom doesn't have mychart set up well. Mom wanted spenser to know the information was there for him to look at   Call ID:      PRESCRIPTION REFILL ONLY  Name of prescription:  Pharmacy:

## 2021-02-27 ENCOUNTER — Other Ambulatory Visit (INDEPENDENT_AMBULATORY_CARE_PROVIDER_SITE_OTHER): Payer: Self-pay

## 2021-02-27 MED ORDER — DEXCOM G6 TRANSMITTER MISC: 1.0000 | 3 refills | Status: DC

## 2021-02-27 MED ORDER — DEXCOM G6 SENSOR MISC: 5 refills | Status: DC

## 2021-05-05 ENCOUNTER — Other Ambulatory Visit (INDEPENDENT_AMBULATORY_CARE_PROVIDER_SITE_OTHER): Payer: Self-pay | Admitting: Family

## 2021-05-06 ENCOUNTER — Telehealth (INDEPENDENT_AMBULATORY_CARE_PROVIDER_SITE_OTHER): Payer: Self-pay

## 2021-05-06 DIAGNOSIS — E1065 Type 1 diabetes mellitus with hyperglycemia: Secondary | ICD-10-CM

## 2021-05-06 MED ORDER — INSULIN ASPART 100 UNIT/ML IJ SOLN: INTRAMUSCULAR | 5 refills | Status: DC

## 2021-05-06 NOTE — Telephone Encounter (Signed)
[facility] but patient is at college and needs it sent to [facility]. I have left a voicemail requesting call back with exact pharmacy name and location.

## 2021-05-06 NOTE — Telephone Encounter (Signed)
[facility]  [address]  Is the pharmacy

## 2021-05-06 NOTE — Addendum Note (Signed)
Addended by: Osa Craver on: 05/06/2021 11:45 AM   Modules accepted: Orders

## 2021-05-07 MED ORDER — HUMALOG 100 UNIT/ML ~~LOC~~ SOLN: SUBCUTANEOUS | 5 refills | Status: DC

## 2021-05-07 NOTE — Addendum Note (Signed)
Addended by: Angelene Giovanni A on: 05/07/2021 02:30 PM   Modules accepted: Orders

## 2021-05-07 NOTE — Telephone Encounter (Signed)
Humalog refill sent to pharmacy

## 2021-05-07 NOTE — Telephone Encounter (Signed)
Please send Hum[facility]og [facility] instead of [facility] [phone]

## 2021-05-08 ENCOUNTER — Telehealth (INDEPENDENT_AMBULATORY_CARE_PROVIDER_SITE_OTHER): Payer: Self-pay

## 2021-05-15 ENCOUNTER — Other Ambulatory Visit (INDEPENDENT_AMBULATORY_CARE_PROVIDER_SITE_OTHER): Payer: Self-pay | Admitting: Family

## 2021-06-04 ENCOUNTER — Ambulatory Visit (INDEPENDENT_AMBULATORY_CARE_PROVIDER_SITE_OTHER): Payer: Managed Care, Other (non HMO) | Admitting: Family

## 2021-06-11 ENCOUNTER — Encounter (INDEPENDENT_AMBULATORY_CARE_PROVIDER_SITE_OTHER): Payer: Self-pay | Admitting: Family

## 2021-06-11 ENCOUNTER — Ambulatory Visit (INDEPENDENT_AMBULATORY_CARE_PROVIDER_SITE_OTHER): Payer: Managed Care, Other (non HMO) | Admitting: Family

## 2021-06-11 ENCOUNTER — Other Ambulatory Visit: Payer: Self-pay

## 2021-06-11 VITALS — BP 112/70 | HR 92 | Wt 147.2 lb

## 2021-06-11 DIAGNOSIS — E1065 Type 1 diabetes mellitus with hyperglycemia: Secondary | ICD-10-CM | POA: Diagnosis not present

## 2021-06-11 DIAGNOSIS — R739 Hyperglycemia, unspecified: Secondary | ICD-10-CM

## 2021-06-11 DIAGNOSIS — Z4681 Encounter for fitting and adjustment of insulin pump: Secondary | ICD-10-CM

## 2021-06-11 LAB — POCT GLYCOSYLATED HEMOGLOBIN (HGB A1C): Hemoglobin A1C: 6.4 % — AB (ref 4.0–5.6)

## 2021-06-11 LAB — POCT GLUCOSE (DEVICE FOR HOME USE): Glucose Fasting, POC: 104 mg/dL — AB (ref 70–99)

## 2021-06-11 NOTE — Patient Instructions (Signed)
Insulin to Carbohydrate Ratio 12AM 12  6am 12  8am 15--> 12   2pm 12--> 10   11pm 12     It was a pleasure seeing you in clinic today. Please do not hesitate to contact me if you have questions or concerns.   Please sign up for MyChart. This is a communication tool that allows you to send an email directly to me. This can be used for questions, prescriptions and blood sugar reports. We will also release labs to you with instructions on MyChart. Please do not use MyChart if you need immediate or emergency assistance. Ask our wonderful front office staff if you need assistance.

## 2021-06-11 NOTE — Progress Notes (Signed)
Pediatric Endocrinology Diabetes Consultation Follow-up Visit  Ethan Acevedo 2001/04/02 [id number]  Chief Complaint: Follow-up type 1 diabetes   [name], MD   HPI: Ethan Acevedo  is a 20 y.o. male presenting for follow-up of type 1 diabetes. he is accompanied to this visit by his mother.   1. [name] was admitted to pediatric ward of the [facility] on 01/28/07 for new onset type 1 diabetes mellitus, mild-moderate diabetic ketoacidosis, dehydration, and ketonuria. He was 48-1/20 years old. He had about a 3-week history of polyuria, polydipsia, and nocturia, and a one-week history of unusual enuresis. The parents brought the child to his pediatrician, Dr. [name]. Dr. [name] checked his BG. The BG was "high" (greater than 500). Urinalysis showed 3+ glucose and 3+ ketones. We started him on Lantus as a basal insulin at bedtime and Novolog aspart as a bolus insulin at meals and at bedtime and 2 AM as needed. On  12/10/07 we converted him to a Medtronic 722 insulin pump.    2. Since last visit to [facility] on 01/2021, he has been well.  No ER visits or hospitalizations.   He is in school at [employer], school is going well. He did a running class while he was in school and has been playing basketball to stay active.   He is using Tslim insulin pump with Dexcom CGM. He feels like things are going pretty well. He has continued with diet changes that he has made. He tries to eat healthy sources of carbs but is not limiting carbs. Tries to bolus before eating but sometimes boluses after. Pump site failures are rare. Hypoglycemia has been rare.    Concerns:  - Blood sugars rarely run high, usually when he goes out eat.    Insulin regimen: Tandem Tslim  Basal Rates 12AM 1.15  4am 1.20  8am 1.25  1pm 1.35       Insulin to Carbohydrate Ratio 12AM 12  6am 12  11am 15   2pm 12  11pm 12     Insulin Sensitivity Factor 12AM 40  6am 35  11pm 40         Target Blood Glucose 12AM 150   6am 120  9pm 150           Hypoglycemia: Able to feel low blood sugars.  No glucagon needed recently.  Insulin Pump download   _ pattern of hyperglycemia between 6pm-12am  Med-alert ID: Not currently wearing.  Injection sites: Legs.  Annual labs due: 01/2022 Ophthalmology due: 06/2020    3. ROS: Greater than 10 systems reviewed with pertinent positives listed in HPI, otherwise neg. Constitutional: Sleeping well. Weight stable.  Eyes: No changes in vision. . No blurry vision  Ears/Nose/Mouth/Throat: No difficulty swallowing. Cardiovascular: No palpitations. Denies chest pain  Respiratory: No increased work of breathing Gastrointestinal: No constipation or diarrhea. No abdominal pain Genitourinary: No nocturia, no polyuria Musculoskeletal: No joint pain Neurologic: Normal sensation, no tremor Endocrine: No polydipsia.  No hyperpigmentation Psychiatric: Normal affect  Past Medical History:   Past Medical History:  Diagnosis Date   ADHD (attention deficit hyperactivity disorder)    Diabetic ketoacidosis, type I ([place])    Goiter    Hypoglycemia associated with diabetes ([place])    Physical growth delay    Type 1 diabetes mellitus in patient age 15-12 years with HbA1C goal below 8     Medications:  Outpatient Encounter Medications as of 06/11/2021  Medication Sig   Blood Glucose Monitoring Suppl (CONTOUR  NEXT EZ) w/Device KIT 1 kit by Does not apply route daily as needed.   Blood Glucose Monitoring Suppl (CONTOUR NEXT MONITOR) w/Device KIT Please specify directions, refills and quantity   Continuous Blood Gluc Sensor (DEXCOM G6 SENSOR) MISC APPLY 1 SENSOR EVERY 10 DAYS   Continuous Blood Gluc Transmit (DEXCOM G6 TRANSMITTER) MISC 1 each by Does not apply route every 3 (three) months.   Glucagon (BAQSIMI TWO PACK) 3 MG/DOSE POWD Place 1 kit into the nose as needed (for severe Hypoglycemia that he cannot eat ot drink).   glucose blood (BAYER CONTOUR NEXT TEST) test strip Check BG  6x day   Insulin Glargine (BASAGLAR KWIKPEN) 100 UNIT/ML Inject up to 50 units per day per provider instructions   insulin lispro (HUMALOG) 100 UNIT/ML injection USE 300 UNITS IN INSULIN PUMP EVERY 48 HOURS   Lancets Misc. (ACCU-CHEK MULTICLIX LANCET DEV) KIT USE AS DIRECTED   Urine Glucose-Ketones Test STRP Use to check urine in cases of hyperglycemia   insulin aspart (NOVOLOG FLEXPEN RELION) 100 UNIT/ML FlexPen Inject up to 50 units per day per provider guidance. Please fill for Novolog Relion PENS (Patient not taking: Reported on 06/11/2021)   insulin aspart (NOVOLOG RELION) 100 UNIT/ML injection Administer up to 300 units into pump every 3 days; please fill for Novolog Relion VIAL (Patient not taking: Reported on 06/11/2021)   insulin aspart (NOVOLOG) 100 UNIT/ML injection Use 300 units in pump every 48 hours. (Patient not taking: Reported on 06/11/2021)   insulin aspart (NOVOLOG) 100 UNIT/ML injection Use 300 units in pump every 48 hours (Patient not taking: Reported on 06/11/2021)   No facility-administered encounter medications on file as of 06/11/2021.    Allergies: Allergies  Allergen Reactions   Humalog [Insulin Lispro] Shortness Of Breath, Diarrhea and Other (See Comments)    Headaches-     Surgical History: History reviewed. No pertinent surgical history.  Family History:  Family History  Problem Relation Age of Onset   Thyroid disease Mother        Papillary thyroid cancer   Cancer Mother      Social History: Lives with: Mother, father and older brother  Junior at [employer]      Physical Exam:  Vitals:   06/11/21 0922  BP: 112/70  Pulse: 92  Weight: 147 lb 4 oz (66.8 kg)    BP 112/70    Pulse 92    Wt 147 lb 4 oz (66.8 kg)    BMI 21.78 kg/m  Body mass index: body mass index is 21.78 kg/m. Growth percentile SmartLinks can only be used for patients less than 18 years old.    Ht Readings from Last 3 Encounters:  08/28/20 5' 8.94" (1.751 m)  02/01/20 _0   (1.753 m) (42 %, Z= -0.21)*  06/07/19 5' 9.06" (1.754 m) (44 %, Z= -0.16)*   * Growth percentiles are based on CDC (Boys, 2-20 Years) data.   Wt Readings from Last 3 Encounters:  06/11/21 147 lb 4 oz (66.8 kg)  01/29/21 143 lb 3.2 oz (65 kg)  08/28/20 159 lb 12.8 oz (72.5 kg)   General: Well developed, well nourished male in no acute distress.  Head: Normocephalic, atraumatic.   Eyes:  Pupils equal and round. EOMI.  Sclera white.  No eye drainage.   Ears/Nose/Mouth/Throat: Nares patent, no nasal drainage.  Normal dentition, mucous membranes moist.  Neck: supple, no cervical lymphadenopathy, no thyromegaly Cardiovascular: regular rate, normal S1/S2, no murmurs Respiratory: No increased work of breathing.  Lungs clear to auscultation bilaterally.  No wheezes. Abdomen: soft, nontender, nondistended. Normal bowel sounds.  No appreciable masses  Extremities: warm, well perfused, cap refill < 2 sec.   Musculoskeletal: Normal muscle mass.  Normal strength Skin: warm, dry.  No rash or lesions. Neurologic: alert and oriented, normal speech, no tremor    Labs: Last hemoglobin A1c:6.0% on 01/2021  Results for orders placed or performed in visit on 06/11/21  POCT glycosylated hemoglobin (Hb A1C)  Result Value Ref Range   Hemoglobin A1C 6.4 (A) 4.0 - 5.6 %   HbA1c POC (<> result, manual entry)     HbA1c, POC (prediabetic range)     HbA1c, POC (controlled diabetic range)    POCT Glucose (Device for Home Use)  Result Value Ref Range   Glucose Fasting, POC 104 (A) 70 - 99 mg/dL   POC Glucose        Assessment/Plan: Nicholson is a 20 y.o. male with uncontrolled type 1 diabetes on insulin pump and CGM therapy. He is doing well with diabetes care. Has a pattern of hyperglycemia after dinner, would benefit from stronger carb ratio. Hemoglobin A1c is 6.4% which meets ADA goal of <7%. He declined influenza vaccine today.    1. DM w/o complication type I, uncontrolled (HCC)/ 2. Hyperglycemia/ 3  hypoglycemia/ 4. Elevated hemoglobin A1c  - Reviewed insulin pump and CGM download. Discussed trends and patterns.  - Rotate pump sites to prevent scar tissue.  - bolus 15 minutes prior to eating to limit blood sugar spikes.  - Reviewed carb counting and importance of accurate carb counting.  - Discussed signs and symptoms of hypoglycemia. Always have glucose available.  - POCT glucose and hemoglobin A1c  - Reviewed growth chart.  - Discussed Dexcom G7 and Tandem Mobile bolus technology.  - Discussed activity with diabetes   - If weight lifting--> blood sugars tend to run higher> ok to bolus before and during activity   - For cardio--> start activity mode 30 minutes prior to activity. If under 150 eat 15 grams of carbs.   5. Insulin pump titration/insulin pump in place.   Insulin to Carbohydrate Ratio 12AM 12  6am 12  8am 15--> 12   2pm 12--> 10   11pm 12     Follow-up:   3 months.  Medical decision-making:  >45 spent today reviewing the medical chart, counseling the patient/family, and documenting today's visit.   When a patient is on insulin, intensive monitoring of blood glucose levels is necessary to avoid hyperglycemia and hypoglycemia. Severe hyperglycemia/hypoglycemia can lead to hospital admissions and be life threatening.    Hermenia Bers,  FNP-C  Pediatric Specialist  [address] [place]  [place], [address]  Tele: [phone]

## 2021-06-18 ENCOUNTER — Ambulatory Visit (INDEPENDENT_AMBULATORY_CARE_PROVIDER_SITE_OTHER): Payer: Managed Care, Other (non HMO) | Admitting: Family

## 2021-08-13 ENCOUNTER — Telehealth (INDEPENDENT_AMBULATORY_CARE_PROVIDER_SITE_OTHER): Payer: Self-pay | Admitting: Family

## 2021-08-13 MED ORDER — DEXCOM G6 SENSOR MISC: 5 refills | Status: DC

## 2021-08-13 NOTE — Telephone Encounter (Signed)
Spoke with mom. Refill for Dexcom sensors sent in for 90 days

## 2021-08-13 NOTE — Telephone Encounter (Signed)
Who's calling (name and relationship to patient) : Ethan Acevedo mom   Best contact number: [phone]  Provider they see: Gretchen Short  Reason for call: Needs a 90 day supply of medication. Insurance will no longer pay for 30 day supply. Patient is out of medication.   Call ID:      PRESCRIPTION REFILL ONLY  Name of prescription:  [facility]

## 2021-11-22 LAB — HM DIABETES EYE EXAM

## 2021-12-31 ENCOUNTER — Other Ambulatory Visit (INDEPENDENT_AMBULATORY_CARE_PROVIDER_SITE_OTHER): Payer: Self-pay | Admitting: Family

## 2021-12-31 DIAGNOSIS — E1065 Type 1 diabetes mellitus with hyperglycemia: Secondary | ICD-10-CM

## 2022-01-23 ENCOUNTER — Encounter (INDEPENDENT_AMBULATORY_CARE_PROVIDER_SITE_OTHER): Payer: Self-pay

## 2022-04-30 ENCOUNTER — Other Ambulatory Visit (INDEPENDENT_AMBULATORY_CARE_PROVIDER_SITE_OTHER): Payer: Self-pay

## 2022-05-02 ENCOUNTER — Telehealth (INDEPENDENT_AMBULATORY_CARE_PROVIDER_SITE_OTHER): Payer: Self-pay | Admitting: Family

## 2022-05-02 NOTE — Telephone Encounter (Signed)
Returned call to mom for more details, left HIPAA approved voicemail for return phone call.

## 2022-05-02 NOTE — Telephone Encounter (Signed)
  Name of who is calling: Ethan Acevedo Relationship to Patient: Mom  Best contact number: [phone]  Provider they see: Gretchen Short  Reason for call: Mom is calling to request [facility].      PRESCRIPTION REFILL ONLY  Name of prescription:  Pharmacy:

## 2022-05-04 ENCOUNTER — Encounter (INDEPENDENT_AMBULATORY_CARE_PROVIDER_SITE_OTHER): Payer: Self-pay

## 2022-05-04 DIAGNOSIS — E1065 Type 1 diabetes mellitus with hyperglycemia: Secondary | ICD-10-CM

## 2022-05-05 MED ORDER — DEXCOM G6 TRANSMITTER MISC: 1.0000 | 1 refills | Status: DC

## 2022-06-03 ENCOUNTER — Ambulatory Visit (INDEPENDENT_AMBULATORY_CARE_PROVIDER_SITE_OTHER): Payer: Managed Care, Other (non HMO) | Admitting: Family

## 2022-06-06 ENCOUNTER — Ambulatory Visit (INDEPENDENT_AMBULATORY_CARE_PROVIDER_SITE_OTHER): Payer: Managed Care, Other (non HMO) | Admitting: Family

## 2022-06-27 ENCOUNTER — Other Ambulatory Visit (INDEPENDENT_AMBULATORY_CARE_PROVIDER_SITE_OTHER): Payer: Self-pay | Admitting: Family

## 2022-06-27 DIAGNOSIS — E1065 Type 1 diabetes mellitus with hyperglycemia: Secondary | ICD-10-CM

## 2022-06-27 NOTE — Telephone Encounter (Signed)
  Name of who is calling: Dyke Brackett Relationship to Patient: Mom  Best contact number: [phone]  Provider they see: Hermenia Bers  Reason for call: Mom called and stated that [name] is at school. Huis pump will not turn on.She want's to know if provider could send a prescription for needles to [facility]  Name of prescription:   [facility] [address]

## 2022-06-28 MED ORDER — INSULIN LISPRO (1 UNIT DIAL) 100 UNIT/ML (KWIKPEN): PEN_INJECTOR | SUBCUTANEOUS | 11 refills | Status: DC

## 2022-06-28 MED ORDER — BASAGLAR KWIKPEN 100 UNIT/ML ~~LOC~~ SOPN: PEN_INJECTOR | SUBCUTANEOUS | 0 refills | Status: DC

## 2022-06-28 MED ORDER — INSULIN PEN NEEDLE 32G X 4 MM MISC: 1 refills | Status: DC

## 2022-07-01 ENCOUNTER — Encounter (INDEPENDENT_AMBULATORY_CARE_PROVIDER_SITE_OTHER): Payer: Self-pay | Admitting: Family

## 2022-07-01 ENCOUNTER — Ambulatory Visit (INDEPENDENT_AMBULATORY_CARE_PROVIDER_SITE_OTHER): Payer: Managed Care, Other (non HMO) | Admitting: Family

## 2022-07-01 VITALS — BP 112/72 | HR 104 | Wt 166.2 lb

## 2022-07-01 DIAGNOSIS — Z4681 Encounter for fitting and adjustment of insulin pump: Secondary | ICD-10-CM

## 2022-07-01 DIAGNOSIS — E1065 Type 1 diabetes mellitus with hyperglycemia: Secondary | ICD-10-CM | POA: Diagnosis not present

## 2022-07-01 LAB — POCT GLYCOSYLATED HEMOGLOBIN (HGB A1C): Hemoglobin A1C: 7.2 % — AB (ref 4.0–5.6)

## 2022-07-01 LAB — POCT GLUCOSE (DEVICE FOR HOME USE): Glucose Fasting, POC: 169 mg/dL — AB (ref 70–99)

## 2022-07-01 NOTE — Patient Instructions (Signed)
Basal Rates 12AM 1.05   2am  0.95   8am 1.05 --> 1.15   2pm 1.05 --> 1.15   11pm  1.05 --> 1.15    Insulin to Carbohydrate Ratio 12AM 12  6am 12  8am 12 --> 10   2pm 10 --> 8   11pm 12 --> 10     It was a pleasure seeing you in clinic today. Please do not hesitate to contact me if you have questions or concerns.   Please sign up for MyChart. This is a communication tool that allows you to send an email directly to me. This can be used for questions, prescriptions and blood sugar reports. We will also release labs to you with instructions on MyChart. Please do not use MyChart if you need immediate or emergency assistance. Ask our wonderful front office staff if you need assistance.

## 2022-07-01 NOTE — Progress Notes (Signed)
Pediatric Endocrinology Diabetes Consultation Follow-up Visit  Ethan Acevedo 12-01-2000 [id number]  Chief Complaint: Follow-up type 1 diabetes   Pcp, No   HPI: Ethan Acevedo  is a 22 y.o. male presenting for follow-up of type 1 diabetes. he is accompanied to this visit by his mother.   1. Ethan Acevedo was admitted to pediatric ward of the [facility] on 01/28/07 for new onset type 1 diabetes mellitus, mild-moderate diabetic ketoacidosis, dehydration, and ketonuria. He was 79-1/22 years old. He had about a 3-week history of polyuria, polydipsia, and nocturia, and a one-week history of unusual enuresis. The parents brought the child to his pediatrician, Dr. [name]. Dr. [name] checked his BG. The BG was "high" (greater than 500). Urinalysis showed 3+ glucose and 3+ ketones. We started him on Lantus as a basal insulin at bedtime and Novolog aspart as a bolus insulin at meals and at bedtime and 2 AM as needed. On  12/10/07 we converted him to a Medtronic 722 insulin pump.    2. Since last visit to [facility] on 05/2021, he has been well.  No ER visits or hospitalizations.   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 He has been spending his free time snowboarding. He has changed his diet back to a "normal" diet.   He reports since changing his diet, his blood sugars tend to run higher throughout the day. He does well bolusing but feels like he underestimates his carbs. He estimates around 70-100 grams of carbs per meal. Using Tandem Tslim insulin pump, reports a few pump site/occlusion issues.   Insulin regimen: Tandem Tslim  Basal Rates 12AM 1.05   2am  0.95   8am 1.15   2pm 1.15   11pm  1.15    Insulin to Carbohydrate Ratio 12AM 12  6am 12  8am 10   2pm 8   11pm 10     Insulin Sensitivity Factor 12AM 40  2am 40   8am  30   2pm 30   11pm  40    Target Blood Glucose 12AM 150  6am 120  9pm 150           Hypoglycemia: Able to feel low  blood sugars.  No glucagon needed recently.  Insulin Pump download    Med-alert ID: Not currently wearing.  Injection sites: Legs and buttocks.   Annual labs due: Next visit  Ophthalmology due: 01/204     3. ROS: Greater than 10 systems reviewed with pertinent positives listed in HPI, otherwise neg. Constitutional: Sleeping well. Energy is good.  Eyes: No changes in vision. . No blurry vision  Ears/Nose/Mouth/Throat: No difficulty swallowing. Cardiovascular: No palpitations. Denies chest pain  Respiratory: No increased work of breathing Gastrointestinal: No constipation or diarrhea. No abdominal pain Genitourinary: No nocturia, no polyuria Musculoskeletal: No joint pain Neurologic: Normal sensation, no tremor Endocrine: No polydipsia.  No hyperpigmentation Psychiatric: Normal affect  Past Medical History:   Past Medical History:  Diagnosis Date   ADHD (attention deficit hyperactivity disorder)    Diabetic ketoacidosis, type I ([place])    Goiter    Hypoglycemia associated with diabetes ([place])    Physical growth delay    Type 1 diabetes mellitus in patient age 94-12 years with HbA1C goal below 8     Medications:  Outpatient Encounter Medications as of 07/01/2022  Medication Sig   Blood Glucose Monitoring Suppl (CONTOUR NEXT MONITOR) w/Device KIT Please specify directions, refills and quantity  Continuous Blood Gluc Sensor (DEXCOM G6 SENSOR) MISC APPLY 1 SENSOR EVERY 10 DAYS   Continuous Blood Gluc Transmit (DEXCOM G6 TRANSMITTER) MISC 1 each by Does not apply route every 3 (three) months.   insulin lispro (HUMALOG) 100 UNIT/ML injection USE 300 UNITS IN INSULIN PUMP EVERY 48 HOURS   Blood Glucose Monitoring Suppl (CONTOUR NEXT EZ) w/Device KIT 1 kit by Does not apply route daily as needed. (Patient not taking: Reported on 07/01/2022)   Glucagon (BAQSIMI TWO PACK) 3 MG/DOSE POWD Place 1 kit into the nose as needed (for severe Hypoglycemia that he cannot eat ot drink). (Patient not  taking: Reported on 07/01/2022)   glucose blood (BAYER CONTOUR NEXT TEST) test strip Check BG 6x day (Patient not taking: Reported on 07/01/2022)   insulin aspart (NOVOLOG FLEXPEN RELION) 100 UNIT/ML FlexPen Inject up to 50 units per day per provider guidance. Please fill for Novolog Relion PENS (Patient not taking: Reported on 06/11/2021)   insulin aspart (NOVOLOG RELION) 100 UNIT/ML injection Administer up to 300 units into pump every 3 days; please fill for Novolog Relion VIAL (Patient not taking: Reported on 06/11/2021)   insulin aspart (NOVOLOG) 100 UNIT/ML injection Use 300 units in pump every 48 hours. (Patient not taking: Reported on 06/11/2021)   insulin aspart (NOVOLOG) 100 UNIT/ML injection Use 300 units in pump every 48 hours (Patient not taking: Reported on 06/11/2021)   Insulin Glargine (BASAGLAR KWIKPEN) 100 UNIT/ML Inject up to 50 units per day per provider instructions (Patient not taking: Reported on 07/01/2022)   insulin lispro (HUMALOG KWIKPEN) 100 UNIT/ML KwikPen Use up to 50 units incase pump fails. (Patient not taking: Reported on 07/01/2022)   Insulin Pen Needle 32G X 4 MM MISC Use with insulin pens to administer insulin. (Patient not taking: Reported on 07/01/2022)   Lancets Misc. (ACCU-CHEK MULTICLIX LANCET DEV) KIT USE AS DIRECTED (Patient not taking: Reported on 07/01/2022)   Urine Glucose-Ketones Test STRP Use to check urine in cases of hyperglycemia (Patient not taking: Reported on 07/01/2022)   No facility-administered encounter medications on file as of 07/01/2022.    Allergies: Allergies  Allergen Reactions   Humalog [Insulin Lispro] Shortness Of Breath, Diarrhea and Other (See Comments)    Headaches-     Surgical History: No past surgical history on file.  Family History:  Family History  Problem Relation Age of Onset   Thyroid disease Mother        Papillary thyroid cancer   Cancer Mother      Social History: Lives with: Mother, father and older brother  Senior  at [employer]      Physical Exam:  Vitals:   07/01/22 1133  BP: 112/72  Pulse: (!) 104  Weight: 166 lb 3.2 oz (75.4 kg)     BP 112/72   Pulse (!) 104   Wt 166 lb 3.2 oz (75.4 kg)   BMI 24.59 kg/m  Body mass index: body mass index is 24.59 kg/m. Growth %ile SmartLinks can only be used for patients less than 46 years old.    Ht Readings from Last 3 Encounters:  08/28/20 5' 8.94" (1.751 m)  02/01/20 5\' 9"  (1.753 m) (42 %, Z= -0.21)*  06/07/19 5' 9.06" (1.754 m) (44 %, Z= -0.16)*   * Growth percentiles are based on CDC (Boys, 2-20 Years) data.   Wt Readings from Last 3 Encounters:  07/01/22 166 lb 3.2 oz (75.4 kg)  06/11/21 147 lb 4 oz (66.8 kg)  01/29/21 143 lb 3.2 oz (  65 kg)   General: Well developed, well nourished male in no acute distress.  Head: Normocephalic, atraumatic.   Eyes:  Pupils equal and round. EOMI.  Sclera white.  No eye drainage.   Ears/Nose/Mouth/Throat: Nares patent, no nasal drainage.  Normal dentition, mucous membranes moist.  Neck: supple, no cervical lymphadenopathy, no thyromegaly Cardiovascular: regular rate, normal S1/S2, no murmurs Respiratory: No increased work of breathing.  Lungs clear to auscultation bilaterally.  No wheezes. Abdomen: soft, nontender, nondistended. Normal bowel sounds.  No appreciable masses  Extremities: warm, well perfused, cap refill < 2 sec.   Musculoskeletal: Normal muscle mass.  Normal strength Skin: warm, dry.  No rash or lesions. Neurologic: alert and oriented, normal speech, no tremor    Labs: Last hemoglobin A1c:6.4% on 05/2021  Results for orders placed or performed in visit on 07/01/22  POCT glycosylated hemoglobin (Hb A1C)  Result Value Ref Range   Hemoglobin A1C 7.2 (A) 4.0 - 5.6 %   HbA1c POC (<> result, manual entry)     HbA1c, POC (prediabetic range)     HbA1c, POC (controlled diabetic range)    POCT Glucose (Device for Home Use)  Result Value Ref Range   Glucose Fasting, POC 169 (A) 70 - 99 mg/dL    POC Glucose        Assessment/Plan: Jayveon is a 22 y.o. male with uncontrolled type 1 diabetes on insulin pump and CGM therapy. Pattern of post prandial hyperglycemia. Hemoglobin A1c is 7.2% today which is higher then ADA goal of <7%. His time in range is 57% with minimal hypoglycemia.     1. DM w/o complication type I, uncontrolled (HCC)/ 2. Hyperglycemia/ - Reviewed insulin pump and CGM download. Discussed trends and patterns.  - Rotate pump sites to prevent scar tissue.  - bolus 15 minutes prior to eating to limit blood sugar spikes.  - Reviewed carb counting and importance of accurate carb counting.  - Discussed signs and symptoms of hypoglycemia. Always have glucose available.  - POCT glucose and hemoglobin A1c  - Reviewed growth chart.  - Discussed increased insulin need with change in diet  - Encouraged annual eye exam.  - Update insulin pump to 7.7 software and Dexcom G7   3. Insulin pump titration/insulin pump in place.    Basal Rates 12AM 1.05   2am  0.95   8am 1.05 --> 1.15   2pm 1.05 --> 1.15   11pm  1.05 --> 1.15    Insulin to Carbohydrate Ratio 12AM 12  6am 12  8am 12 --> 10   2pm 10 --> 8   11pm 12 --> 10     Follow-up:   3 months.  Medical decision-making:  LOS: >40  spent today reviewing the medical chart, counseling the patient/family, and documenting today's visit.    When a patient is on insulin, intensive monitoring of blood glucose levels is necessary to avoid hyperglycemia and hypoglycemia. Severe hyperglycemia/hypoglycemia can lead to hospital admissions and be life threatening.    Gretchen Short,  FNP-C  Pediatric Specialist  [address]  [place], [address]  Tele: [phone]

## 2022-07-17 ENCOUNTER — Other Ambulatory Visit (INDEPENDENT_AMBULATORY_CARE_PROVIDER_SITE_OTHER): Payer: Self-pay | Admitting: Family

## 2022-07-17 ENCOUNTER — Telehealth (INDEPENDENT_AMBULATORY_CARE_PROVIDER_SITE_OTHER): Payer: Self-pay | Admitting: Family

## 2022-07-17 MED ORDER — INSULIN LISPRO 100 UNIT/ML IJ SOLN: INTRAMUSCULAR | 3 refills | Status: DC

## 2022-07-17 NOTE — Telephone Encounter (Signed)
  Name of who is calling: Dyke Brackett Relationship to Patient: Mom  Best contact number: [phone]  Provider they see: Hermenia Bers   Reason for call: Mom is calling to get prior auth. She said he is low.      PRESCRIPTION REFILL ONLY  Name of prescription: Humalog  Pharmacy: [place], [place] [address].

## 2022-07-18 MED ORDER — INSULIN LISPRO 100 UNIT/ML IJ SOLN: INTRAMUSCULAR | 3 refills | Status: DC

## 2022-07-18 NOTE — Telephone Encounter (Signed)
New rx sent in

## 2022-07-21 ENCOUNTER — Other Ambulatory Visit (INDEPENDENT_AMBULATORY_CARE_PROVIDER_SITE_OTHER): Payer: Self-pay

## 2022-07-21 ENCOUNTER — Telehealth (INDEPENDENT_AMBULATORY_CARE_PROVIDER_SITE_OTHER): Payer: Self-pay | Admitting: Family

## 2022-07-21 MED ORDER — INSULIN ASPART 100 UNIT/ML IJ SOLN: INTRAMUSCULAR | 5 refills | Status: DC

## 2022-07-21 NOTE — Telephone Encounter (Signed)
Spoke with Vicente Males. [facility]'t [facility]. I have sent in vials to [facility] states that she will pay out of pocket. I will call insurance to see why they wont cover vials.

## 2022-07-21 NOTE — Telephone Encounter (Signed)
  Name of who is calling: Ethan Acevedo  Caller's Relationship to Patient: Mother  Best contact number: [phone]  Provider they see: Gabriel Rung   Reason for call: In urgent need of a insulin refill, is currently low. Wanting back up insulin as well.      PRESCRIPTION REFILL ONLY  Name of prescription: Novolog (back-up)  Pharmacy: [place]

## 2022-07-24 NOTE — Telephone Encounter (Signed)
Called mom, relayed that vials had been sent to [facility].  Mom stated that was for back up only we are suppose to be figuring out what the insurance covers and getting back with her.  She stated this has been going on for weeks and he is getting low on insulin.  He has some but not much and this needs to be figured out.  The last resort will be to pay the out of pocket cost of $75/vial at [facility].

## 2022-07-24 NOTE — Telephone Encounter (Signed)
  Name of who is calling: Thomos Lemons  Caller's Relationship to Patient: Mother  Best contact number: [phone]  Provider they see: Hedda Slade  Reason for call: Mother says that she is waiting for an update for prescription refill. She states she has not gotten it yet.     PRESCRIPTION REFILL ONLY  Name of prescription:  Pharmacy:

## 2022-07-25 MED ORDER — INSULIN LISPRO 100 UNIT/ML IJ SOLN: INTRAMUSCULAR | 6 refills | Status: DC

## 2022-07-25 NOTE — Telephone Encounter (Signed)
Called [employer]. The quantity was to large to get a paid claim. The covered amount in 40 mL per 30 days. Has to be insulin lispro.   Called mom to let her know. No message left, vm is full. New rx sent to [facility].

## 2022-07-25 NOTE — Addendum Note (Signed)
Addended by: Ammie Ferrier on: 07/25/2022 11:32 AM   Modules accepted: Orders

## 2022-07-30 ENCOUNTER — Telehealth (INDEPENDENT_AMBULATORY_CARE_PROVIDER_SITE_OTHER): Payer: Self-pay

## 2022-07-30 NOTE — Telephone Encounter (Signed)
Spoke with Vicente Males. She asked why the insulin had changed. I told her that its the generic of Humalog and that's what insurance pays for, also only pays for 30 days. She was upset but understood.

## 2022-08-14 ENCOUNTER — Other Ambulatory Visit (HOSPITAL_COMMUNITY): Payer: Self-pay

## 2022-08-14 ENCOUNTER — Other Ambulatory Visit (INDEPENDENT_AMBULATORY_CARE_PROVIDER_SITE_OTHER): Payer: Self-pay | Admitting: Family

## 2022-08-14 ENCOUNTER — Telehealth (INDEPENDENT_AMBULATORY_CARE_PROVIDER_SITE_OTHER): Payer: Self-pay | Admitting: Family

## 2022-08-14 ENCOUNTER — Other Ambulatory Visit: Payer: Self-pay

## 2022-08-14 DIAGNOSIS — E1065 Type 1 diabetes mellitus with hyperglycemia: Secondary | ICD-10-CM

## 2022-08-14 MED ORDER — INSULIN LISPRO 100 UNIT/ML IJ SOLN
INTRAMUSCULAR | 5 refills | Status: DC
  Filled 2022-08-14: qty 40, 30d supply, fill #0
  Filled 2022-08-18 (×2): qty 40, 27d supply, fill #0

## 2022-08-14 MED ORDER — DEXCOM G6 SENSOR MISC: 1 refills | Status: DC

## 2022-08-14 NOTE — Telephone Encounter (Signed)
Mom called back, he needs a refill for sensors, she spoke with the pharmacy and they are going to fill it differently but she doesn' tknow if the person she spoke with the see the script, placed note for pharmacy to speak with mom before filling.

## 2022-08-14 NOTE — Telephone Encounter (Signed)
  Name of who is calling: Dyke Brackett Relationship to Patient: mom  Best contact number: [phone]  Provider they see: Hedda Slade   Reason for call:he is down to his last bottle of insulin and right now the generic Humalog it is on back order, trying to get an emergency authorization fill for insulin for the other brand he used before. Mom is worried he will run out.      PRESCRIPTION REFILL ONLY  Name of prescription:  Pharmacy:

## 2022-08-14 NOTE — Telephone Encounter (Signed)
Called pharmacy to follow up, all the [facility] near [place] are showing out of stock.   Messaged [facility] ordering S.P. about insulin lispro, they have it in stock and can mail it anywhere in [place] or [place].  Arrives in cooling container within a few days.  Called mom to provide update, she really wants an emergency order, I explained that by the time we got it prior authorized, denied and appeal sent he might be out of insulin and/or the pharmacy may have it in stock by then but other pharmacies have it in stock that I don't know we would get it approved.  She stated that she doesn't know about him getting mail at his apartment.  I asked her to follow up on that and find out more details about where it can be shipped.  She asked if I had a direct number to call back so she could follow up.  I told her I did not, but I could send the script to [facility] and have them place the order on hold until she can call them with more details and provided her with their number.  She verbalized understanding.

## 2022-08-18 ENCOUNTER — Other Ambulatory Visit (HOSPITAL_COMMUNITY): Payer: Self-pay

## 2022-08-18 ENCOUNTER — Other Ambulatory Visit: Payer: Self-pay

## 2022-09-11 ENCOUNTER — Other Ambulatory Visit (INDEPENDENT_AMBULATORY_CARE_PROVIDER_SITE_OTHER): Payer: Self-pay | Admitting: Family

## 2022-09-11 DIAGNOSIS — E1065 Type 1 diabetes mellitus with hyperglycemia: Secondary | ICD-10-CM

## 2022-10-03 ENCOUNTER — Ambulatory Visit (INDEPENDENT_AMBULATORY_CARE_PROVIDER_SITE_OTHER): Payer: Managed Care, Other (non HMO) | Admitting: Family

## 2022-11-07 LAB — HM DIABETES EYE EXAM

## 2022-11-20 ENCOUNTER — Ambulatory Visit (INDEPENDENT_AMBULATORY_CARE_PROVIDER_SITE_OTHER): Payer: Managed Care, Other (non HMO) | Admitting: Family

## 2022-11-20 ENCOUNTER — Encounter (INDEPENDENT_AMBULATORY_CARE_PROVIDER_SITE_OTHER): Payer: Self-pay | Admitting: Family

## 2022-11-20 VITALS — BP 108/64 | HR 76 | Wt 155.6 lb

## 2022-11-20 DIAGNOSIS — E1065 Type 1 diabetes mellitus with hyperglycemia: Secondary | ICD-10-CM

## 2022-11-20 DIAGNOSIS — Z4681 Encounter for fitting and adjustment of insulin pump: Secondary | ICD-10-CM

## 2022-11-20 LAB — POCT GLYCOSYLATED HEMOGLOBIN (HGB A1C): Hemoglobin A1C: 6.6 % — AB (ref 4.0–5.6)

## 2022-11-20 LAB — POCT GLUCOSE (DEVICE FOR HOME USE): Glucose Fasting, POC: 133 mg/dL — AB (ref 70–99)

## 2022-11-20 NOTE — Progress Notes (Signed)
Pediatric Endocrinology Diabetes Consultation Follow-up Visit  Ethan Acevedo 2001/01/24 [id number]  Chief Complaint: Follow-up type 1 diabetes   Pcp, No   HPI: [name]  is a 22 y.o. male presenting for follow-up of type 1 diabetes. he is accompanied to this visit by his mother.   1. [name] was admitted to pediatric ward of the [facility] on 01/28/07 for new onset type 1 diabetes mellitus, mild-moderate diabetic ketoacidosis, dehydration, and ketonuria. He was 46-1/22 years old. He had about a 3-week history of polyuria, polydipsia, and nocturia, and a one-week history of unusual enuresis. The parents brought the child to his pediatrician, Dr. Loyola Mast. Dr. Rana Snare checked his BG. The BG was "high" (greater than 500). Urinalysis showed 3+ glucose and 3+ ketones. We started him on Lantus as a basal insulin at bedtime and Novolog aspart as a bolus insulin at meals and at bedtime and 2 AM as needed. On  12/10/07 we converted him to a Medtronic 722 insulin pump.    2. Since last visit to [facility] on 06/2022, he has been well.  No ER visits or hospitalizations.   He graduated from [employer] this spring, will start at [employer]. He is walking and working out daily for activity.   Using Tslim insulin pump and Dexcom G6, he plans to update to dexcom G7 soon. He was having frequent pump site failures while using his legs so he is using his stomach more. He boluses before he eats most of the time. He does well with carb counting and reports a wide range of carbs. He has not gotten many low blood sugars, he is able to feel symptoms when going low.   Concerns - Would like to do Tslim update for G7  - Insurance/planning since he is starting a new job.    Insulin regimen: Tandem Tslim  Basal Rates 12AM 1.05   2am  0.95   8am 1.15   2pm 1.15   11pm  1.15    Insulin to Carbohydrate Ratio 12AM 12  6am 12  8am 10   2pm 8   11pm 10    Insulin Sensitivity Factor 12AM 40  2am 40   8am  30    2pm 30   11pm  40    Target Blood Glucose 12AM 150  6am 120  9pm 150           Hypoglycemia: Able to feel low blood sugars.  No glucagon needed recently.  Insulin Pump download     Med-alert ID: Not currently wearing.  Injection sites: Legs and buttocks.   Annual labs due: Next visit  Ophthalmology due: 06/2022 --> discussed importance of annual eye exam.     3. ROS: Greater than 10 systems reviewed with pertinent positives listed in HPI, otherwise neg. Constitutional: Sleeping well. Weight stable.  Eyes: No changes in vision. . No blurry vision  Ears/Nose/Mouth/Throat: No difficulty swallowing. Cardiovascular: No palpitations. Denies chest pain  Respiratory: No increased work of breathing Gastrointestinal: No constipation or diarrhea. No abdominal pain Genitourinary: No nocturia, no polyuria Musculoskeletal: No joint pain Neurologic: Normal sensation, no tremor Endocrine: No polydipsia.  No hyperpigmentation Psychiatric: Normal affect  Past Medical History:   Past Medical History:  Diagnosis Date   ADHD (attention deficit hyperactivity disorder)    Diabetic ketoacidosis, type I (HCC)    Goiter    Hypoglycemia associated with diabetes (HCC)    Physical growth delay    Type 1 diabetes mellitus in patient  age 52-12 years with HbA1C goal below 8     Medications:  Outpatient Encounter Medications as of 11/20/2022  Medication Sig   Continuous Blood Gluc Sensor (DEXCOM G6 SENSOR) MISC APPLY 1 SENSOR EVERY 10 DAYS   Continuous Blood Gluc Transmit (DEXCOM G6 TRANSMITTER) MISC 1 each by Does not apply route every 3 (three) months.   insulin lispro (HUMALOG) 100 UNIT/ML injection USE 300 UNITS IN INSULIN PUMP EVERY 48 HOURS   Blood Glucose Monitoring Suppl (CONTOUR NEXT EZ) w/Device KIT 1 kit by Does not apply route daily as needed. (Patient not taking: Reported on 07/01/2022)   Blood Glucose Monitoring Suppl (CONTOUR NEXT MONITOR) w/Device KIT Please specify directions,  refills and quantity (Patient not taking: Reported on 11/20/2022)   Glucagon (BAQSIMI TWO PACK) 3 MG/DOSE POWD Place 1 kit into the nose as needed (for severe Hypoglycemia that he cannot eat ot drink). (Patient not taking: Reported on 07/01/2022)   glucose blood (BAYER CONTOUR NEXT TEST) test strip Check BG 6x day (Patient not taking: Reported on 07/01/2022)   Insulin Glargine (BASAGLAR KWIKPEN) 100 UNIT/ML Inject up to 50 units per day per provider instructions (Patient not taking: Reported on 07/01/2022)   insulin lispro (HUMALOG KWIKPEN) 100 UNIT/ML KwikPen Use up to 50 units incase pump fails. (Patient not taking: Reported on 07/01/2022)   Insulin Pen Needle 32G X 4 MM MISC Use with insulin pens to administer insulin. (Patient not taking: Reported on 07/01/2022)   Lancets Misc. (ACCU-CHEK MULTICLIX LANCET DEV) KIT USE AS DIRECTED (Patient not taking: Reported on 07/01/2022)   Urine Glucose-Ketones Test STRP Use to check urine in cases of hyperglycemia (Patient not taking: Reported on 07/01/2022)   [DISCONTINUED] insulin aspart (NOVOLOG FLEXPEN RELION) 100 UNIT/ML FlexPen Inject up to 50 units per day per provider guidance. Please fill for Novolog Relion PENS (Patient not taking: Reported on 06/11/2021)   [DISCONTINUED] insulin aspart (NOVOLOG RELION) 100 UNIT/ML injection Administer up to 300 units into pump every 3 days; please fill for Novolog Relion VIAL (Patient not taking: Reported on 06/11/2021)   [DISCONTINUED] insulin aspart (NOVOLOG) 100 UNIT/ML injection Use 300 units in pump every 48 hours. (Patient not taking: Reported on 06/11/2021)   [DISCONTINUED] insulin aspart (NOVOLOG) 100 UNIT/ML injection Use 300 units in pump every 48 hours (Patient not taking: Reported on 11/20/2022)   [DISCONTINUED] insulin lispro (HUMALOG) 100 UNIT/ML injection Use 300 units in pump every 48 hours (Patient not taking: Reported on 11/20/2022)   [DISCONTINUED] insulin lispro (HUMALOG) 100 UNIT/ML injection Inject up to 300  units in pump every 48 hours. (Patient not taking: Reported on 11/20/2022)   No facility-administered encounter medications on file as of 11/20/2022.    Allergies: Allergies  Allergen Reactions   Humalog [Insulin Lispro] Shortness Of Breath, Diarrhea and Other (See Comments)    Headaches-     Surgical History: No past surgical history on file.  Family History:  Family History  Problem Relation Age of Onset   Thyroid disease Mother        Papillary thyroid cancer   Cancer Mother      Social History: Lives with: Mother, father and older brother  Senior at [employer]      Physical Exam:  Vitals:   11/20/22 1140  BP: 108/64  Pulse: 76  Weight: 155 lb 9.6 oz (70.6 kg)      BP 108/64   Pulse 76   Wt 155 lb 9.6 oz (70.6 kg)   BMI 23.02 kg/m  Body mass  index: body mass index is 23.02 kg/m. Growth %ile SmartLinks can only be used for patients less than 22 years old.    Ht Readings from Last 3 Encounters:  08/28/20 5' 8.94" (1.751 m)  02/01/20 5\' 9"  (1.753 m) (42 %, Z= -0.21)*  06/07/19 5' 9.06" (1.754 m) (44 %, Z= -0.16)*   * Growth percentiles are based on CDC (Boys, 2-20 Years) data.   Wt Readings from Last 3 Encounters:  11/20/22 155 lb 9.6 oz (70.6 kg)  07/01/22 166 lb 3.2 oz (75.4 kg)  06/11/21 147 lb 4 oz (66.8 kg)   General: Well developed, well nourished male in no acute distress.  Head: Normocephalic, atraumatic.   Eyes:  Pupils equal and round. EOMI.  Sclera white.  No eye drainage.   Ears/Nose/Mouth/Throat: Nares patent, no nasal drainage.  Normal dentition, mucous membranes moist.  Neck: supple, no cervical lymphadenopathy, no thyromegaly Cardiovascular: regular rate, normal S1/S2, no murmurs Respiratory: No increased work of breathing.  Lungs clear to auscultation bilaterally.  No wheezes. Abdomen: soft, nontender, nondistended. Normal bowel sounds.  No appreciable masses  Extremities: warm, well perfused, cap refill < 2 sec.   Musculoskeletal: Normal  muscle mass.  Normal strength Skin: warm, dry.  No rash or lesions. Neurologic: alert and oriented, normal speech, no tremor    Labs: Last hemoglobin A1c:7,2% on 06/2022  Results for orders placed or performed in visit on 11/20/22  POCT glycosylated hemoglobin (Hb A1C)  Result Value Ref Range   Hemoglobin A1C 6.6 (A) 4.0 - 5.6 %   HbA1c POC (<> result, manual entry)     HbA1c, POC (prediabetic range)     HbA1c, POC (controlled diabetic range)    POCT Glucose (Device for Home Use)  Result Value Ref Range   Glucose Fasting, POC 133 (A) 70 - 99 mg/dL   POC Glucose        Assessment/Plan: Kaleo is a 22 y.o. male with uncontrolled type 1 diabetes on insulin pump and CGM therapy.  He has a pattern of hyperglycemia after lunch and dinner, will adjust carb ratio. Hemoglobin A1c has improved to 6.6% and meets ADA goal of <7%. His time in target range is 70%.   1. DM w/o complication type I, uncontrolled (HCC)/ 2. Hyperglycemia/ - Reviewed insulin pump and CGM download. Discussed trends and patterns.  - Rotate pump sites to prevent scar tissue.  - bolus 15 minutes prior to eating to limit blood sugar spikes.  - Reviewed carb counting and importance of accurate carb counting.  - Discussed signs and symptoms of hypoglycemia. Always have glucose available.  - POCT glucose and hemoglobin A1c  - Reviewed growth chart.  - Discussed new diabetes tech including Dexcom G7 update for tslim. - Discussed insurance plans as he starts his new job.  - Transition to adult endocrinology following his next visit.  Orders Placed This Encounter  Procedures   COMPLETE METABOLIC PANEL WITH GFR   Lipid panel   Microalbumin / creatinine urine ratio   T4, free   TSH   POCT glycosylated hemoglobin (Hb A1C)   POCT Glucose (Device for Home Use)   COLLECTION CAPILLARY BLOOD SPECIMEN    3. Insulin pump titration/insulin pump in place.   Basal Rates 12AM 1.05   2am  0.95   8am 1.15   2pm 1.15   11pm   1.15    Insulin to Carbohydrate Ratio 12AM 12  6am 12  8am 10 --> 9   2pm 8 -->  6   11pm 10    Insulin Sensitivity Factor 12AM 40  2am 40   8am  30   2pm 30   11pm  40    Target Blood Glucose 12AM 150  6am 120  9pm 150           Follow-up:   3 months.  Medical decision-making:  LOS: >40  spent today reviewing the medical chart, counseling the patient/family, and documenting today's visit.  When a patient is on insulin, intensive monitoring of blood glucose levels is necessary to avoid hyperglycemia and hypoglycemia. Severe hyperglycemia/hypoglycemia can lead to hospital admissions and be life threatening.    Gretchen Short,  FNP-C  Pediatric Specialist  [address]  [place], [address]  Tele: [phone]

## 2022-11-20 NOTE — Patient Instructions (Addendum)
Insulin to Carbohydrate Ratio 12AM 12  6am 12  8am 10 --> 9   2pm 8 --> 6   11pm 10     It was a pleasure seeing you in clinic today. Please do not hesitate to contact me if you have questions or concerns.   Please sign up for MyChart. This is a communication tool that allows you to send an email directly to me. This can be used for questions, prescriptions and blood sugar reports. We will also release labs to you with instructions on MyChart. Please do not use MyChart if you need immediate or emergency assistance. Ask our wonderful front office staff if you need assistance.   Hi!  The t:slim X2 with control IQ technology insulin pump is now compatible with the Dexcom G7 continuous glucose monitor (CGM). There are a few important key points to understand:  Software update (for existing t:slim users) -To use the t:slim with the Dexcom G7 this requires t:slim software 7.7 (not 7.6)  -To check what software your pump is currently using unlock your pump then press options then press Mypump then press Pump Info then scroll down to see what it states under t:slim software   -Refer to https://www.[email or web address]/support/software-[email or web address]/control-iq-technology or call [phone] for assistance with upgrade  Dexcom G7 -The t:slimX2 compatible Dexcom G7 CGM will have a white line under the serial number printed on the box (refer to image below)   -The pharmacy cannot control if they can order the Dexcom G7 with or without the line. They have the same national drug code [facility]) numbers, thus making it impossible to specifically request the Dexcom G7 with the line.  -If you received an incompatible, Dexcom G7 sensor to communicate with your t:slimX2 insulin pump with 7.7 software installed, please visit [email or web address] to request a compatible sensor or contact Dexcom customer support at [phone].  -Eventually, all Dexcom G7 with the line will be the ONLY Dexcom G7 product  available once Dexcom has sold all Dexcom G7 without the line.    Thank you!

## 2022-11-22 LAB — COMPLETE METABOLIC PANEL WITH GFR
AG Ratio: 2 (calc) (ref 1.0–2.5)
ALT: 12 U/L (ref 9–46)
AST: 13 U/L (ref 10–40)
Albumin: 4.5 g/dL (ref 3.6–5.1)
Alkaline phosphatase (APISO): 68 U/L (ref 36–130)
BUN: 13 mg/dL (ref 7–25)
CO2: 28 mmol/L (ref 20–32)
Calcium: 9.5 mg/dL (ref 8.6–10.3)
Chloride: 98 mmol/L (ref 98–110)
Creat: 1.05 mg/dL (ref 0.60–1.24)
Globulin: 2.3 g/dL (calc) (ref 1.9–3.7)
Glucose, Bld: 160 mg/dL — ABNORMAL HIGH (ref 65–139)
Potassium: 4.3 mmol/L (ref 3.5–5.3)
Sodium: 137 mmol/L (ref 135–146)
Total Bilirubin: 0.6 mg/dL (ref 0.2–1.2)
Total Protein: 6.8 g/dL (ref 6.1–8.1)
eGFR: 103 mL/min/{1.73_m2} (ref >=60)

## 2022-11-22 LAB — MICROALBUMIN / CREATININE URINE RATIO
Creatinine, Urine: 261 mg/dL (ref 20–320)
Microalb Creat Ratio: 3 mg/g creat (ref <30)
Microalb, Ur: 0.7 mg/dL

## 2022-11-22 LAB — TSH: TSH: 2.4 mIU/L (ref 0.40–4.50)

## 2022-11-22 LAB — LIPID PANEL
Cholesterol: 180 mg/dL (ref <200)
HDL: 69 mg/dL (ref >=40)
LDL Cholesterol (Calc): 96 mg/dL (calc)
Non-HDL Cholesterol (Calc): 111 mg/dL (calc) (ref <130)
Total CHOL/HDL Ratio: 2.6 (calc) (ref <5.0)
Triglycerides: 64 mg/dL (ref <150)

## 2022-11-22 LAB — T4, FREE: Free T4: 1.4 ng/dL (ref 0.8–1.8)

## 2022-12-05 ENCOUNTER — Telehealth (INDEPENDENT_AMBULATORY_CARE_PROVIDER_SITE_OTHER): Payer: Self-pay

## 2022-12-05 ENCOUNTER — Encounter (INDEPENDENT_AMBULATORY_CARE_PROVIDER_SITE_OTHER): Payer: Self-pay

## 2022-12-05 DIAGNOSIS — E1065 Type 1 diabetes mellitus with hyperglycemia: Secondary | ICD-10-CM

## 2022-12-05 MED ORDER — DEXCOM G6 TRANSMITTER MISC
1.0000 | 1 refills | Status: DC
Start: 2022-12-05 — End: 2023-05-25

## 2022-12-05 NOTE — Telephone Encounter (Signed)
Transmitter sent.  

## 2023-01-08 ENCOUNTER — Encounter (INDEPENDENT_AMBULATORY_CARE_PROVIDER_SITE_OTHER): Payer: Self-pay

## 2023-02-20 ENCOUNTER — Ambulatory Visit (INDEPENDENT_AMBULATORY_CARE_PROVIDER_SITE_OTHER): Payer: Managed Care, Other (non HMO) | Admitting: Family

## 2023-02-20 ENCOUNTER — Encounter (INDEPENDENT_AMBULATORY_CARE_PROVIDER_SITE_OTHER): Payer: Self-pay | Admitting: Family

## 2023-02-20 VITALS — BP 110/60 | HR 94 | Wt 158.2 lb

## 2023-02-20 DIAGNOSIS — Z9641 Presence of insulin pump (external) (internal): Secondary | ICD-10-CM | POA: Diagnosis not present

## 2023-02-20 DIAGNOSIS — E1065 Type 1 diabetes mellitus with hyperglycemia: Secondary | ICD-10-CM

## 2023-02-20 LAB — POCT GLYCOSYLATED HEMOGLOBIN (HGB A1C): Hemoglobin A1C: 6.5 % — AB (ref 4.0–5.6)

## 2023-02-20 LAB — POCT GLUCOSE (DEVICE FOR HOME USE): Glucose Fasting, POC: 112 mg/dL — AB (ref 70–99)

## 2023-02-20 NOTE — Patient Instructions (Addendum)
It was a pleasure seeing you in clinic today. Please do not hesitate to contact me if you have questions or concerns.   Please sign up for MyChart. This is a communication tool that allows you to send an email directly to me. This can be used for questions, prescriptions and blood sugar reports. We will also release labs to you with instructions on MyChart. Please do not use MyChart if you need immediate or emergency assistance. Ask our wonderful front office staff if you need assistance.   Referral to adult endocrinology placed. Please let me know if you have any questions

## 2023-02-20 NOTE — Progress Notes (Signed)
Pediatric Endocrinology Diabetes Consultation Follow-up Visit  Ethan Acevedo September 14, 2000 [id number]  Chief Complaint: Follow-up type 1 diabetes   Pcp, No   HPI: Ethan Acevedo  is a 22 y.o. male presenting for follow-up of type 1 diabetes. he is accompanied to this visit by his mother.   1. Ethan Acevedo was admitted to pediatric ward of the [facility] on 01/28/07 for new onset type 1 diabetes mellitus, mild-moderate diabetic ketoacidosis, dehydration, and ketonuria. He was 40-1/22 years old. He had about a 3-week history of polyuria, polydipsia, and nocturia, and a one-week history of unusual enuresis. The parents brought the child to his pediatrician, Dr. Loyola Mast. Dr. Rana Snare checked his BG. The BG was "high" (greater than 500). Urinalysis showed 3+ glucose and 3+ ketones. We started him on Lantus as a basal insulin at bedtime and Novolog aspart as a bolus insulin at meals and at bedtime and 2 AM as needed. On  12/10/07 we converted him to a Medtronic 722 insulin pump.    2. Since last visit to [facility] on 06/2022, he has been well.  No ER visits or hospitalizations.   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 He has not had much time to exercise other then when he is at work.   He feels like diabetes care has been very good since starting his new job. He is bolusing 15 minutes before eating which has helped reduce his spike. Estimates eating between 60-70 grams of carbs per meal. He has not had many low blood sugars, none severe.     Insulin regimen: Tandem Tslim  Basal Rates 12AM 1.05   2am  0.95   8am 1.15   2pm 1.15   11pm  1.15    Insulin to Carbohydrate Ratio 12AM 12  6am 12  8am 9  2pm 7   11pm 10    Insulin Sensitivity Factor 12AM 40  2am 40   8am  30   2pm 30   11pm  40    Target Blood Glucose 12AM 150  6am 120  9pm 150           Hypoglycemia: Able to feel low blood sugars.  No glucagon needed recently.  Insulin Pump download     Med-alert ID: Not  currently wearing.  Injection sites: Legs and buttocks.   Annual labs due: 10/2023 Ophthalmology due: 10/2023--. Last visit did not have retinopathy.      3. ROS: Greater than 10 systems reviewed with pertinent positives listed in HPI, otherwise neg. Constitutional: Sleeping well. Weight stable.  Eyes: No changes in vision. . No blurry vision  Ears/Nose/Mouth/Throat: No difficulty swallowing. Cardiovascular: No palpitations. Denies chest pain  Respiratory: No increased work of breathing Gastrointestinal: No constipation or diarrhea. No abdominal pain Genitourinary: No nocturia, no polyuria Musculoskeletal: No joint pain Neurologic: Normal sensation, no tremor Endocrine: No polydipsia.  No hyperpigmentation Psychiatric: Normal affect  Past Medical History:   Past Medical History:  Diagnosis Date   ADHD (attention deficit hyperactivity disorder)    Diabetic ketoacidosis, type I (HCC)    Goiter    Hypoglycemia associated with diabetes (HCC)    Physical growth delay    Type 1 diabetes mellitus in patient age 51-12 years with HbA1C goal below 8     Medications:  Outpatient Encounter Medications as of 02/20/2023  Medication Sig   Continuous Blood Gluc Sensor (DEXCOM G6 SENSOR) MISC APPLY 1 SENSOR EVERY 10 DAYS   Continuous Glucose Transmitter (DEXCOM  G6 TRANSMITTER) MISC 1 each by Does not apply route every 3 (three) months.   insulin lispro (HUMALOG) 100 UNIT/ML injection USE 300 UNITS IN INSULIN PUMP EVERY 48 HOURS   Blood Glucose Monitoring Suppl (CONTOUR NEXT EZ) w/Device KIT 1 kit by Does not apply route daily as needed. (Patient not taking: Reported on 07/01/2022)   Blood Glucose Monitoring Suppl (CONTOUR NEXT MONITOR) w/Device KIT Please specify directions, refills and quantity (Patient not taking: Reported on 11/20/2022)   Glucagon (BAQSIMI TWO PACK) 3 MG/DOSE POWD Place 1 kit into the nose as needed (for severe Hypoglycemia that he cannot eat ot drink). (Patient not taking:  Reported on 07/01/2022)   glucose blood (BAYER CONTOUR NEXT TEST) test strip Check BG 6x day (Patient not taking: Reported on 07/01/2022)   Insulin Glargine (BASAGLAR KWIKPEN) 100 UNIT/ML Inject up to 50 units per day per provider instructions (Patient not taking: Reported on 07/01/2022)   insulin lispro (HUMALOG KWIKPEN) 100 UNIT/ML KwikPen Use up to 50 units incase pump fails. (Patient not taking: Reported on 07/01/2022)   Insulin Pen Needle 32G X 4 MM MISC Use with insulin pens to administer insulin. (Patient not taking: Reported on 07/01/2022)   Lancets Misc. (ACCU-CHEK MULTICLIX LANCET DEV) KIT USE AS DIRECTED (Patient not taking: Reported on 07/01/2022)   Urine Glucose-Ketones Test STRP Use to check urine in cases of hyperglycemia (Patient not taking: Reported on 07/01/2022)   No facility-administered encounter medications on file as of 02/20/2023.    Allergies: Allergies  Allergen Reactions   Humalog [Insulin Lispro] Shortness Of Breath, Diarrhea and Other (See Comments)    Headaches-     Surgical History: History reviewed. No pertinent surgical history.  Family History:  Family History  Problem Relation Age of Onset   Thyroid disease Mother        Papillary thyroid cancer   Cancer Mother      Social History: Lives with: Mother, father and older brother  Senior at [employer]      Physical Exam:  Vitals:   02/20/23 1009  BP: 110/60  Pulse: 94  Weight: 158 lb 3.2 oz (71.8 kg)      BP 110/60   Pulse 94   Wt 158 lb 3.2 oz (71.8 kg)   BMI 23.40 kg/m  Body mass index: body mass index is 23.4 kg/m. Growth %ile SmartLinks can only be used for patients less than 42 years old.    Ht Readings from Last 3 Encounters:  08/28/20 5' 8.94" (1.751 m)  02/01/20 5\' 9"  (1.753 m) (42%, Z= -0.21)*  06/07/19 5' 9.06" (1.754 m) (44%, Z= -0.16)*   * Growth percentiles are based on CDC (Boys, 2-20 Years) data.   Wt Readings from Last 3 Encounters:  02/20/23 158 lb 3.2 oz (71.8 kg)  11/20/22  155 lb 9.6 oz (70.6 kg)  07/01/22 166 lb 3.2 oz (75.4 kg)   General: Well developed, well nourished male in no acute distress.  Head: Normocephalic, atraumatic.   Eyes:  Pupils equal and round. EOMI.  Sclera white.  No eye drainage.   Ears/Nose/Mouth/Throat: Nares patent, no nasal drainage.  Normal dentition, mucous membranes moist.  Neck: supple, no cervical lymphadenopathy, no thyromegaly Cardiovascular: regular rate, normal S1/S2, no murmurs Respiratory: No increased work of breathing.  Lungs clear to auscultation bilaterally.  No wheezes. Abdomen: soft, nontender, nondistended. Normal bowel sounds.  No appreciable masses  Extremities: warm, well perfused, cap refill < 2 sec.   Musculoskeletal: Normal muscle mass.  Normal strength  Skin: warm, dry.  No rash or lesions. Neurologic: alert and oriented, normal speech, no tremor    Labs:  Results for orders placed or performed in visit on 02/20/23  POCT glycosylated hemoglobin (Hb A1C)  Result Value Ref Range   Hemoglobin A1C 6.5 (A) 4.0 - 5.6 %   HbA1c POC (<> result, manual entry)     HbA1c, POC (prediabetic range)     HbA1c, POC (controlled diabetic range)    POCT Glucose (Device for Home Use)  Result Value Ref Range   Glucose Fasting, POC 112 (A) 70 - 99 mg/dL   POC Glucose        Assessment/Plan: Denzelle is a 22 y.o. male with uncontrolled type 1 diabetes on insulin pump and CGM therapy. [place] has done excellent with diabetes care since last visit. Hemoglobin A1c is 6.5% which meets ADA goal of <7%. His time in target range is 73% which also meets goal of >70%.   1. Type 1 diabetes  2. Hyperglycemia/ - Reviewed insulin pump and CGM download. Discussed trends and patterns.  - Rotate pump sites to prevent scar tissue.  - bolus 15 minutes prior to eating to limit blood sugar spikes.  - Reviewed carb counting and importance of accurate carb counting.  - Discussed signs and symptoms of hypoglycemia. Always have glucose  available.  - POCT glucose and hemoglobin A1c  - Reviewed growth chart.  - Refer to adult endocrinology. Discussed referral process. He will have up to 6 months worth of refill to allow him time to establish care.  - Praise given for improvements with diabetes care.   3. Insulin pump titration/insulin pump in place.   Insulin pump in place.    Follow-up:   3 months.  Medical decision-making:  LOS: >30 spent today reviewing the medical chart, counseling the patient/family, and documenting today's visit.   When a patient is on insulin, intensive monitoring of blood glucose levels is necessary to avoid hyperglycemia and hypoglycemia. Severe hyperglycemia/hypoglycemia can lead to hospital admissions and be life threatening.    Gretchen Short,  FNP-C  Pediatric Specialist  [address]  [place], [address]  Tele: [phone]

## 2023-02-21 ENCOUNTER — Other Ambulatory Visit (INDEPENDENT_AMBULATORY_CARE_PROVIDER_SITE_OTHER): Payer: Self-pay | Admitting: Family

## 2023-02-21 ENCOUNTER — Encounter (INDEPENDENT_AMBULATORY_CARE_PROVIDER_SITE_OTHER): Payer: Self-pay

## 2023-02-21 DIAGNOSIS — E1065 Type 1 diabetes mellitus with hyperglycemia: Secondary | ICD-10-CM

## 2023-02-23 ENCOUNTER — Telehealth: Payer: Self-pay

## 2023-02-23 DIAGNOSIS — E1065 Type 1 diabetes mellitus with hyperglycemia: Secondary | ICD-10-CM

## 2023-02-23 MED ORDER — INSULIN LISPRO 100 UNIT/ML IJ SOLN
INTRAMUSCULAR | 6 refills | Status: DC
Start: 2023-02-23 — End: 2023-05-25

## 2023-05-22 ENCOUNTER — Encounter (INDEPENDENT_AMBULATORY_CARE_PROVIDER_SITE_OTHER): Payer: Self-pay

## 2023-05-22 DIAGNOSIS — E1065 Type 1 diabetes mellitus with hyperglycemia: Secondary | ICD-10-CM

## 2023-05-25 MED ORDER — INSULIN LISPRO (1 UNIT DIAL) 100 UNIT/ML (KWIKPEN): PEN_INJECTOR | SUBCUTANEOUS | 1 refills | Status: DC

## 2023-05-25 MED ORDER — BAQSIMI TWO PACK 3 MG/DOSE NA POWD: 1.0000 | NASAL | 0 refills | Status: DC | PRN

## 2023-05-25 MED ORDER — GLUCOSE BLOOD VI STRP
ORAL_STRIP | 1 refills | Status: AC
Start: 2023-05-25 — End: ?

## 2023-05-25 MED ORDER — BASAGLAR KWIKPEN 100 UNIT/ML ~~LOC~~ SOPN
PEN_INJECTOR | SUBCUTANEOUS | 1 refills | Status: DC
Start: 2023-05-25 — End: 2023-06-08

## 2023-05-25 MED ORDER — INSULIN PEN NEEDLE 32G X 4 MM MISC: 0 refills | Status: DC

## 2023-05-25 MED ORDER — DEXCOM G6 SENSOR MISC: 0 refills | Status: DC

## 2023-05-25 MED ORDER — INSULIN LISPRO 100 UNIT/ML IJ SOLN: INTRAMUSCULAR | 0 refills | Status: DC

## 2023-05-25 MED ORDER — DEXCOM G6 TRANSMITTER MISC: 1.0000 | 0 refills | Status: DC

## 2023-06-08 ENCOUNTER — Other Ambulatory Visit (INDEPENDENT_AMBULATORY_CARE_PROVIDER_SITE_OTHER): Payer: Self-pay | Admitting: Family

## 2023-06-08 DIAGNOSIS — E1065 Type 1 diabetes mellitus with hyperglycemia: Secondary | ICD-10-CM

## 2023-07-13 ENCOUNTER — Ambulatory Visit (INDEPENDENT_AMBULATORY_CARE_PROVIDER_SITE_OTHER): Payer: Managed Care, Other (non HMO) | Admitting: Internal Medicine

## 2023-07-13 ENCOUNTER — Encounter: Payer: Self-pay | Admitting: Internal Medicine

## 2023-07-13 VITALS — BP 120/70 | HR 84 | Ht 68.94 in | Wt 182.6 lb

## 2023-07-13 DIAGNOSIS — E1065 Type 1 diabetes mellitus with hyperglycemia: Secondary | ICD-10-CM

## 2023-07-13 LAB — POCT GLYCOSYLATED HEMOGLOBIN (HGB A1C): Hemoglobin A1C: 6.8 % — AB (ref 4.0–5.6)

## 2023-07-13 MED ORDER — BAQSIMI TWO PACK 3 MG/DOSE NA POWD: 1.0000 | NASAL | 99 refills | Status: AC | PRN

## 2023-07-13 MED ORDER — LYUMJEV KWIKPEN 100 UNIT/ML ~~LOC~~ SOPN: PEN_INJECTOR | SUBCUTANEOUS | 3 refills | Status: DC

## 2023-07-13 MED ORDER — INSULIN LISPRO 100 UNIT/ML IJ SOLN
INTRAMUSCULAR | 3 refills | Status: AC
Start: 2023-07-13 — End: ?

## 2023-07-13 MED ORDER — DEXCOM G6 SENSOR MISC: 3 refills | Status: DC

## 2023-07-13 MED ORDER — DEXCOM G6 TRANSMITTER MISC: 1.0000 | 0 refills | Status: DC

## 2023-07-13 NOTE — Patient Instructions (Addendum)
Please continue: - basal rates: 12 am: 1.05 units/h 2 am: 0.95 8 am: 1.15 - ICR:  12 am: 1:12 >> 1:9 8 am: 1:9 2 pm: 1:7 6 pm: 1:7 >> 1:6 11 PM: 1:10 >> 1:9 - ISF: 12 am: 1:40 8 am: 1:30 11 PM: 1:40 - target: 12 am: 110  Try to frontload the carbs before meals.  Use the Calorie Brooke Dare, My Fitness Pal. Also, use Yuka.  Please read: Think Like a Pancreas  Please return in 3 to 4 months.  Basic Rules for Patients with Type I Diabetes Mellitus  The American Diabetes Association (ADA) recommended targets: - fasting sugar 80-130 - after meal sugar <180 - HbA1C <7%  Engage in >=150 min moderate exercise per week  Make sure you have >=8h of sleep every night as this helps both blood sugars and your weight.  Always keep a sugar log (not only record in your meter) and bring it to all appointments with [place].  If you are on a pump, know how to access the settings and to modify the parameters.   Remember, you can always call the number on the back of the pump for emergencies related to the pump.  "15-15 rule" for hypoglycemia: if sugars are low, take 15 g of carbs** ("fast sugar" - e.g. 4 glucose tablets, 4 oz orange juice), wait 15 min, then check sugars again. If still <80, repeat. Continue  until your sugars >80, then eat a normal meal.   Teach family members and coworkers to inject glucagon. Have a glucagon set at home and one at work. They should call 911 after using the set.  If you are on a pump, set "insulin on board" time for 5 hours (if your sugars tend to be higher, can use 4 hours).   If you are on a pump, use the "dual wave bolus" setting for high fat foods (e.g. pizza). Start with a setting of 50%-50% (50% instant bolus and 50% prolonged bolus over 3h, for e.g.).    If you are on a pump, make sure the basal daily insulin dose is approximately equal (not larger) to the daily insulin you get from boluses, otherwise you are at risk for hypoglycemia.  Check sugar  before driving. If <100, correct, and only start driving if sugars rise >=829. Check sugar every hour when on a long drive.  Check sugar before exercising. If <100, correct, and only start exercising if sugars rise >=100. Check sugar every hour when on a long exercise routine and 1h after you finished exercising.   If >250, check urine for ketones. If you have moderate-large ketones in urine, do not start exercise. Hydrate yourself with clear liquids and correct the high sugar. Recheck sugars and ketones before attempting to exercise.  Be aware that you might need less insulin when exercising.  *intense, short, exercise bursts can increase your sugars, but  *less intense, longer (>1h), exercise routines can decrease your sugars.  If you are on a pump, you might need to decrease your basal rate by 10% or more (or even disconnect your pump) while you exercise to prevent low sugars. Do not disconnect your pump by more than 3 hours at a time! You also might need to decrease your insulin bolus for the meal prior to your exercise time by 20% or more.  Make sure you have a MedAlert bracelet or pendant mentioning "Type I Diabetes Mellitus". If you have a prior episode of severe hypoglycemia or hypoglycemia unawareness, it  should also mention this.  Please do not walk barefoot. Inspect your feet for sores/cuts and let us know if you have them.  **E.g. of "fast carbs": first choice (15 g):  1 tube glucose gel, GlucoPouch 15, 2 oz glucose liquid second choice (15-16 g):  3 or 4 glucose tablets (best taken  with water), 15 Dextrose Bits chewable third choice (15-20 g):   cup fruit juice,  cup regular soda, 1 cup skim milk,  1 cup sports drink fourth choice (15-20 g):  1 small tube Cakemate gel (not frosting), 2 tbsp raisins, 1 tbsp table sugar,  candy, jelly beans, gum drops - check package for carb amount   (adapted from: Juluis Rainier. "Insulin therapy and hypoglycemia" Endocrinol Metab Clin N  Am 2012, 41: 57-87)  Sick Day Rules for Diabetes  Think S-K-I-L-L:  Sugars:  - if glucose >200, check every 3h and drink sugar free liquids  - if glucose <200, drink carb-containing liquids and recheck 30 min later  - if glucose high, correct with insulin  - if sugars <60, initiate hypoglycemia management (take 15 g of fast carbs and check sugars in 15 min  - repeat until sugars remain >100).  Ketones:  When to check ketones?  When glucose >300 x2 if on insulin injections (>300 x 1 if on insulin pump). When nausea, vomiting, diarrhea, abdominal pain, headache, fever - even if glucose is normal or low - because in this case, you need both glucose and insulin.    - if you have ketone strips for blood >> if ketones are more or equal than 0.6, need to increase insulin - if you have ketone strips for urine >> if ketones are more or equal than "small", need to increase insulin  Insulin: Never skip long acting insulin, even if not eating!   Urine ketones Blood ketones Extra insulin?  no <0.6 no  small 0.6-1.5 Increase dose by 5%  moderate 1.5-3 Increase dose by 10%  large >3 Increase dose by at least 20%   Liquids: - if glucose >200, check every 3h and drink sugar free liquids  - if glucose <200, small sips of carb-containing liquids (e.g. Ginger ale, Gatorade, juice, etc.)  Let us know!   Call us if: Go to ED if: Call your primary care doctor if:  Sugars >300 for >8h Severe abdominal pain Fever >100F for 24h  Moderate to large  urine ketones or blood ketones >1.5 Difficulty breathing Other chronic diseases flaring up  Vomiting and unable to keep liquids down Signs of dehydration

## 2023-07-13 NOTE — Progress Notes (Signed)
Patient ID: Ethan Acevedo, male   DOB: 2001-02-20, 23 y.o.   MRN: [id number]  HPI: Ethan Acevedo is a 23 y.o.-year-old male, referred by his pediatric endocrinologist, Gretchen Short, NP, for management of DM1, diagnosed in [birth year] (23 years old), uncontrolled, without long term complications.  He is here with his mother who offers part of the history including his diabetes diagnosis, activity, and diet.  Reviewed HbA1c levels: Lab Results  Component Value Date   HGBA1C 6.5 (A) 02/20/2023   HGBA1C 6.6 (A) 11/20/2022   HGBA1C 7.2 (A) 07/01/2022   HGBA1C 6.4 (A) 06/11/2021   HGBA1C 6.0 (A) 01/29/2021   HGBA1C 6.9 (A) 08/28/2020   HGBA1C 8.1 (A) 02/01/2020   HGBA1C 8.0 06/07/2019   HGBA1C 8.9 (A) 06/24/2018   HGBA1C 10.9 (A) 02/18/2018   HGBA1C 8.9 02/12/2017   HGBA1C 9.9 09/25/2016   HGBA1C 11.7% 04/09/2016   HGBA1C 10.1 09/26/2015   HGBA1C 11.6 07/04/2015   HGBA1C 9.3 02/12/2015   HGBA1C 8.9 11/02/2014   HGBA1C 11.6 07/11/2014   HGBA1C 10.8 03/16/2014   HGBA1C 11.1 (H) 11/24/2013   HGBA1C 10.6 11/24/2013   HGBA1C 10.0 08/31/2013   HGBA1C 8.3 04/21/2013   HGBA1C 8.1 02/07/2013   HGBA1C 7.5 09/16/2012   Insulin pump:  - Medtronic started 2009 - nowT:slim X2 (replacement)  CGM: - Dexcom g6  Insulin: - Humalog (-Backup regimen with Basaglar)  Supplies: - [facility]  Pump settings: - basal rates: 12 am: 1.05 units/h 2 am: 0.95 8 am: 1.15 - ICR:  12 am: 1:12 8 am: 1:9 2 pm: 1:7 11 PM: 1:10 - ISF: 12 am: 1:40 8 am: 1:30 11 PM: 1:40 - target: 12 am: 150 >> 110 6 am: 120 >> 110 9 pm: 150 >> 110 - Insulin on Board:  - bolus wizard: on - extended bolusing: not using - changes infusion site: q3 days TDD from basal insulin: 45% (27.41 units) TDD from bolus insulin: 55% (33.24 units) Total daily dose: 61-100 units  Meter: Contour next >> Accu-Chek  Pt checks his sugars >4x a day:  Lowest sugar was 48 x1 (at work); he has hypoglycemia awareness at 70.   Does have a glucagon kit at home. No previous hypoglycemia admission.  Highest sugar was 300s. + previous DKA admissions - 2: at dx and at 23-13 y/o.  Pt's meals are: - Breakfast: skip or McDonalds - Lunch: take out  - Dinner: home cooked most of the time, pizza - Snacks: no sweet drinks; PB crackers  - no CKD, last BUN/creatinine:  Lab Results  Component Value Date   BUN 13 11/21/2022   BUN 14 02/18/2018   CREATININE 1.05 11/21/2022   CREATININE 0.93 02/18/2018   Lab Results  Component Value Date   MICRALBCREAT 3 11/21/2022   MICRALBCREAT 4 01/29/2021   MICRALBCREAT 10 06/07/2019   MICRALBCREAT NOTE 02/18/2018   MICRALBCREAT 37 (H) 04/09/2016   MICRALBCREAT 4.6 02/12/2015   MICRALBCREAT 5.5 11/24/2013   MICRALBCREAT 13.6 05/29/2011   - last set of lipids: Lab Results  Component Value Date   CHOL 180 11/21/2022   HDL 69 11/21/2022   LDLCALC 96 11/21/2022   TRIG 64 11/21/2022   CHOLHDL 2.6 11/21/2022   - last eye exam was 11/07/2022: No DR.   - no numbness and tingling in his feet.  Last TSH: Lab Results  Component Value Date   TSH 2.40 11/21/2022   Pt has no FH of DM. MS and SLE in MGF.  He  also has a history of ADHD.  He now works at [employer].  ROS: Constitutional: no fatigue,, no increased urination or thirst, no nocturia Eyes: no blurry vision ENT: n no dysphagia or odynophagia Cardiovascular: no CP, no SOB Respiratory: no cough, no SOB Gastrointestinal: no N/V Musculoskeletal: no muscle, no joint aches  Past Medical History:  Diagnosis Date   ADHD (attention deficit hyperactivity disorder)    Diabetic ketoacidosis, type I (HCC)    Goiter    Hypoglycemia associated with diabetes (HCC)    Physical growth delay    Type 1 diabetes mellitus in patient age 7-12 years with HbA1C goal below 8    No past surgical history on file. Social History   Socioeconomic History   Marital status: Single    Spouse name: Not on file   Number of children: Not on  file   Years of education: Not on file   Highest education level: Not on file  Occupational History   Not on file  Tobacco Use   Smoking status: Never   Smokeless tobacco: Never  Substance and Sexual Activity   Alcohol use: Yes   Drug use: No   Sexual activity: Not on file  Other Topics Concern   Not on file  Social History Narrative   Lives with Mom, Dad, brother and sister and 2 dogs.  Graduated from [employer]   Social Drivers of Health   Financial Resource Strain: Not on file  Food Insecurity: Not on file  Transportation Needs: Not on file  Physical Activity: Not on file  Stress: Not on file  Social Connections: Not on file  Intimate Partner Violence: Not on file   Current Outpatient Medications on File Prior to Visit  Medication Sig Dispense Refill   Blood Glucose Monitoring Suppl (CONTOUR NEXT EZ) w/Device KIT 1 kit by Does not apply route daily as needed. (Patient not taking: Reported on 07/01/2022) 2 kit 5   Blood Glucose Monitoring Suppl (CONTOUR NEXT MONITOR) w/Device KIT Please specify directions, refills and quantity (Patient not taking: Reported on 11/20/2022) 1 kit 0   Continuous Glucose Sensor (DEXCOM G6 SENSOR) MISC APPLY 1 SENSOR EVERY 10 DAYS 9 each 0   Continuous Glucose Transmitter (DEXCOM G6 TRANSMITTER) MISC 1 each by Does not apply route every 3 (three) months. 1 each 0   Glucagon (BAQSIMI TWO PACK) 3 MG/DOSE POWD Place 1 kit into the nose as needed (for severe Hypoglycemia that he cannot eat ot drink). 2 each 0   glucose blood (BAYER CONTOUR NEXT TEST) test strip Check BG 6x day 200 each 1   Insulin Glargine (BASAGLAR KWIKPEN) 100 UNIT/ML INJECT UP TO 50 UNITS PER DAY PER PROVIDER INSTRUCTIONS 45 mL 0   insulin lispro (HUMALOG KWIKPEN) 100 UNIT/ML KwikPen Use up to 50 units incase pump fails. 15 mL 1   insulin lispro (HUMALOG) 100 UNIT/ML injection USE 300 UNITS IN INSULIN PUMP EVERY 48 HOURS 150 mL 0   Insulin Pen Needle 32G X 4 MM MISC Use with insulin pens  to administer insulin. 600 each 0   Lancets Misc. (ACCU-CHEK MULTICLIX LANCET DEV) KIT USE AS DIRECTED (Patient not taking: Reported on 07/01/2022) 1 each 0   Urine Glucose-Ketones Test STRP Use to check urine in cases of hyperglycemia (Patient not taking: Reported on 07/01/2022) 50 strip 6   No current facility-administered medications on file prior to visit.   Allergies  Allergen Reactions   Humalog [Insulin Lispro] Shortness Of Breath, Diarrhea and Other (See Comments)  Headaches-    Family History  Problem Relation Age of Onset   Thyroid disease Mother        Papillary thyroid cancer   Cancer Mother    PE: BP 120/70   Pulse 84   Ht 5' 8.94" (1.751 m)   Wt 182 lb 9.6 oz (82.8 kg)   SpO2 98%   BMI 27.01 kg/m  Wt Readings from Last 3 Encounters:  07/13/23 182 lb 9.6 oz (82.8 kg)  02/20/23 158 lb 3.2 oz (71.8 kg)  11/20/22 155 lb 9.6 oz (70.6 kg)   Constitutional: normal weight, in NAD Eyes:  EOMI, no exophthalmos ENT: no neck masses, no cervical lymphadenopathy Cardiovascular: RRR, No MRG Respiratory: CTA B Musculoskeletal: no deformities Skin:no rashes Neurological: no tremor with outstretched hands  ASSESSMENT: 1. DM1, uncontrolled, without long-term complications, but with hyperglycemia - h/o DKA at diagnosis, in 2008  2. Goiter  PLAN:  1. Patient with long-standing, fairly well controlled DM1. Today: 6.8% (higher, but still at goal). - at today's visit we reviewed his pump downloads and CGM tracings CGM interpretation: -At today's visit, we reviewed his CGM downloads: It appears that 63% of values are in target range (goal >70%), while 37% are higher than 180 (goal <25%), and 0.1% are lower than 70 (goal <4%).  The calculated average blood sugar is 172.  The projected HbA1c for the next 3 months (GMI) is 7.4%. -Reviewing the CGM trends, sugars appear to be fluctuating in the upper half of the target range overnight, initially elevated at bedtime and then improving  in the second half of the night and they are variable after breakfast and lunch, with only occasional hyperglycemic spikes after these meals.  However, after dinner, the blood sugars are almost 100% elevated above the 180 mg/dL target.  Upon reviewing individual daily traces, it appears that he enters the carbohydrates in a staggering fashion especially for dinner and we discussed about trying to enter these at the beginning of the meal, if possible.  He is also doing a better job using Humalog 15 minutes before the meal but is not always able to do so.  He had 1 instance in which she bolused too soon before the meal and he dropped his sugars before he was able to eat.  We did discuss about potentially using Lyumjev especially as he mentions that he does not like Humalog too much.  He felt that he was doing better on NovoLog but this is not covered by his insurance anymore.  I did explain that Lyumjev is an insulin that could be injected at the start of the meal and he is interested to try it.  I sent 1 vial to his pharmacy to try it and advised him to let me know if he wants me to send more. -I recommended to strengthen his insulin to carb ratios for dinner and overnight, as he does mention that sometimes he would eat at night when out with friends -At today's visit, we also discussed about entering 40 to 50% of the protein amount as carbs into the pump if he eats a low-carb or high-protein meals.  He was not aware of this. -I recommended to try to eliminate McDonald's but if he does have high calorie meals to start with green vegetables (he introduced salads more recently). -He had his best blood sugar in the day in which she was snowboarding all day and we discussed that he may expect low blood sugars during the night in  the situation.  We also discussed about strategies to avoid this by increasing the target overnight, switching to sleep mode and he tells me that he may have a higher carb meal before going  to bed in that situation. -I recommended a refresher and carb counting.  Discussed about phone applications that can help with this. - I suggested to: Patient Instructions  Please continue: - basal rates: 12 am: 1.05 units/h 2 am: 0.95 8 am: 1.15 - ICR:  12 am: 1:12 >> 1:9 8 am: 1:9 2 pm: 1:7 6 pm: 1:7 >> 1:6 11 PM: 1:10 >> 1:9 - ISF: 12 am: 1:40 8 am: 1:30 11 PM: 1:40 - target: 12 am: 110  Try to frontload the carbs before meals.  Use the Calorie Brooke Dare, My Fitness Pal. Also, use Yuka.  Please read: Think Like a Pancreas  Please return in 3 to 4 months.  - continue checking sugars at different times of the day - check at least 4 times a day, rotating checks - given foot care handout and explained the principles  - given instructions for hypoglycemia management "15-15 rule"  - advised for yearly eye exams - sent glucagon kit Rx to pharmacy [facility]) and discussed about how and when to use it - advised to get ketone strips - advised to always have Glu tablets with him - advised for a Med-alert bracelet mentioning "type 1 diabetes mellitus". - given instruction Re: exercising and driving in DM1 (pt instructions) - also, given information about sick day rules - no signs of other autoimmune disorders - he is due for a foot exam-will check this at next visit - Return to clinic in 3 to 4 months.  2. Goiter -When he was seeing Dr. Fransico Michael as a child -Reviewed previous TSH: Normal: Lab Results  Component Value Date   TSH 2.40 11/21/2022  -no neck compression symptoms or thyroid nodules felt on palpation of his neck today -Will continue to follow him expectantly  Carlus Pavlov, MD PhD [facility] Endocrinology

## 2023-07-14 ENCOUNTER — Other Ambulatory Visit: Payer: Self-pay | Admitting: Internal Medicine

## 2023-07-15 ENCOUNTER — Other Ambulatory Visit: Payer: Self-pay | Admitting: Internal Medicine

## 2023-07-15 MED ORDER — LYUMJEV 100 UNIT/ML IJ SOLN: INTRAMUSCULAR | 11 refills | Status: AC

## 2023-09-29 ENCOUNTER — Encounter (INDEPENDENT_AMBULATORY_CARE_PROVIDER_SITE_OTHER): Payer: Self-pay

## 2023-10-12 ENCOUNTER — Encounter (INDEPENDENT_AMBULATORY_CARE_PROVIDER_SITE_OTHER): Payer: Self-pay

## 2023-11-30 ENCOUNTER — Ambulatory Visit: Payer: Managed Care, Other (non HMO) | Admitting: Internal Medicine

## 2023-11-30 NOTE — Progress Notes (Deleted)
 Patient ID: Ethan Acevedo, male   DOB: 26-Apr-2001, 23 y.o.   MRN: [id number]  HPI: Ethan Acevedo is a 23 y.o.-year-old male, referred by his pediatric endocrinologist, Candee Cha, NP, for management of DM1, diagnosed in [birth year]37 (40.23 years old), uncontrolled, without long term complications.  He is here with his mother who offers part of the history including his diabetes diagnosis, activity, and diet.  Interim history: No increased urination, blurry vision, nausea, chest pain.  Reviewed HbA1c levels: Lab Results  Component Value Date   HGBA1C 6.8 (A) 07/13/2023   HGBA1C 6.5 (A) 02/20/2023   HGBA1C 6.6 (A) 11/20/2022   HGBA1C 7.2 (A) 07/01/2022   HGBA1C 6.4 (A) 06/11/2021   HGBA1C 6.0 (A) 01/29/2021   HGBA1C 6.9 (A) 08/28/2020   HGBA1C 8.1 (A) 02/01/2020   HGBA1C 8.0 06/07/2019   HGBA1C 8.9 (A) 06/24/2018   HGBA1C 10.9 (A) 02/18/2018   HGBA1C 8.9 02/12/2017   HGBA1C 9.9 09/25/2016   HGBA1C 11.7% 04/09/2016   HGBA1C 10.1 09/26/2015   HGBA1C 11.6 07/04/2015   HGBA1C 9.3 02/12/2015   HGBA1C 8.9 11/02/2014   HGBA1C 11.6 07/11/2014   HGBA1C 10.8 03/16/2014   HGBA1C 11.1 (H) 11/24/2013   HGBA1C 10.6 11/24/2013   HGBA1C 10.0 08/31/2013   HGBA1C 8.3 04/21/2013   HGBA1C 8.1 02/07/2013   Insulin  pump:  - Medtronic started 2009 - nowT:slim X2 (replacement)  CGM: - Dexcom g6  Insulin : - Humalog  >> Lyumjev  (-Backup regimen with Basaglar )  Supplies: - [facility]  Pump settings: - basal rates: 12 am: 1.05 units/h 2 am: 0.95 8 am: 1.15 - ICR:  12 am: 1:12 >> 1:9 8 am: 1:9 2 pm: 1:7 6 pm: 1:7 >> 1:6 11 PM: 1:10 >> 1:9 - ISF: 12 am: 1:40 8 am: 1:30 11 PM: 1:40 - target: 12 am: 150 >> 110 6 am: 120 >> 110 9 pm: 150 >> 110 - Insulin  on Board:  - bolus wizard: on - extended bolusing: not using - changes infusion site: q3 days TDD from basal insulin : 45% (27.41 units) TDD from bolus insulin : 55% (33.24 units) Total daily dose: 61-100 units  Meter: Contour next  >> Accu-Chek  Pt checks his sugars >4x a day:  Previously:  Lowest sugar was 48 x1 (at work); he has hypoglycemia awareness at 70.  Does have a glucagon  kit at home. No previous hypoglycemia admission.  Highest sugar was 300s. + previous DKA admissions - 2: at dx and at 21-13 y/o.  Pt's meals are: - Breakfast: skip or McDonalds - Lunch: take out  - Dinner: home cooked most of the time, pizza - Snacks: no sweet drinks; PB crackers  - no CKD, last BUN/creatinine:  Lab Results  Component Value Date   BUN 13 11/21/2022   BUN 14 02/18/2018   CREATININE 1.05 11/21/2022   CREATININE 0.93 02/18/2018   Lab Results  Component Value Date   MICRALBCREAT 3 11/21/2022   MICRALBCREAT 4 01/29/2021   MICRALBCREAT 10 06/07/2019   MICRALBCREAT NOTE 02/18/2018   MICRALBCREAT 37 (H) 04/09/2016   MICRALBCREAT 4.6 02/12/2015   MICRALBCREAT 5.5 11/24/2013   MICRALBCREAT 13.6 05/29/2011   - last set of lipids: Lab Results  Component Value Date   CHOL 180 11/21/2022   HDL 69 11/21/2022   LDLCALC 96 11/21/2022   TRIG 64 11/21/2022   CHOLHDL 2.6 11/21/2022   - last eye exam was 11/07/2022: No DR.   - no numbness and tingling in his feet.  Last TSH: Lab  Results  Component Value Date   TSH 2.40 11/21/2022   Pt has no FH of DM. MS and SLE in MGF.  He also has a history of ADHD.  He now works at [employer].  ROS: + see HPI  Past Medical History:  Diagnosis Date   ADHD (attention deficit hyperactivity disorder)    Diabetic ketoacidosis, type I (HCC)    Goiter    Hypoglycemia associated with diabetes (HCC)    Physical growth delay    Type 1 diabetes mellitus in patient age 53-12 years with HbA1C goal below 8    No past surgical history on file. Social History   Socioeconomic History   Marital status: Single    Spouse name: Not on file   Number of children: Not on file   Years of education: Not on file   Highest education level: Not on file  Occupational History   Not on file   Tobacco Use   Smoking status: Never   Smokeless tobacco: Never  Substance and Sexual Activity   Alcohol use: Yes   Drug use: No   Sexual activity: Not on file  Other Topics Concern   Not on file  Social History Narrative   Lives with Mom, Dad, brother and sister and 2 dogs.  Graduated from [employer]   Social Drivers of Health   Financial Resource Strain: Not on file  Food Insecurity: Not on file  Transportation Needs: Not on file  Physical Activity: Not on file  Stress: Not on file  Social Connections: Not on file  Intimate Partner Violence: Not on file   Current Outpatient Medications on File Prior to Visit  Medication Sig Dispense Refill   Blood Glucose Monitoring Suppl (CONTOUR NEXT EZ) w/Device KIT 1 kit by Does not apply route daily as needed. 2 kit 5   Blood Glucose Monitoring Suppl (CONTOUR NEXT MONITOR) w/Device KIT Please specify directions, refills and quantity 1 kit 0   Continuous Glucose Sensor (DEXCOM G6 SENSOR) MISC APPLY 1 SENSOR EVERY 10 DAYS 9 each 3   Continuous Glucose Transmitter (DEXCOM G6 TRANSMITTER) MISC 1 each by Does not apply route every 3 (three) months. 1 each 0   Glucagon  (BAQSIMI  TWO PACK) 3 MG/DOSE POWD Place 1 kit into the nose as needed (for severe Hypoglycemia that he cannot eat ot drink). 2 each PRN   glucose blood (BAYER CONTOUR NEXT TEST) test strip Check BG 6x day 200 each 1   Insulin  Glargine (BASAGLAR  KWIKPEN) 100 UNIT/ML INJECT UP TO 50 UNITS PER DAY PER PROVIDER INSTRUCTIONS 45 mL 0   insulin  lispro (HUMALOG  KWIKPEN) 100 UNIT/ML KwikPen Use up to 50 units incase pump fails. 15 mL 1   insulin  lispro (HUMALOG ) 100 UNIT/ML injection USE 300 UNITS IN INSULIN  PUMP EVERY 48 HOURS 150 mL 3   Insulin  Lispro-aabc (LYUMJEV ) 100 UNIT/ML SOLN Use up to 300 units in the pump every 48h 10 mL 11   Insulin  Pen Needle 32G X 4 MM MISC Use with insulin  pens to administer insulin . 600 each 0   Lancets Misc. (ACCU-CHEK MULTICLIX LANCET DEV) KIT USE AS  DIRECTED 1 each 0   Urine Glucose-Ketones Test STRP Use to check urine in cases of hyperglycemia 50 strip 6   No current facility-administered medications on file prior to visit.   Allergies  Allergen Reactions   Humalog  [Insulin  Lispro] Shortness Of Breath, Diarrhea and Other (See Comments)    Headaches-    Family History  Problem Relation Age of  Onset   Thyroid  disease Mother        Papillary thyroid  cancer   Cancer Mother    PE: There were no vitals taken for this visit. Wt Readings from Last 3 Encounters:  07/13/23 182 lb 9.6 oz (82.8 kg)  02/20/23 158 lb 3.2 oz (71.8 kg)  11/20/22 155 lb 9.6 oz (70.6 kg)   Constitutional: normal weight, in NAD Eyes:  EOMI, no exophthalmos ENT: no neck masses, no cervical lymphadenopathy Cardiovascular: RRR, No MRG Respiratory: CTA B Musculoskeletal: no deformities Skin:no rashes Neurological: no tremor with outstretched hands  ASSESSMENT: 1. DM1, uncontrolled, without long-term complications, but with hyperglycemia - h/o DKA at diagnosis, in 2008  2. Goiter  PLAN:  1. Patient with longstanding, fairly well-controlled type 1 diabetes.  HbA1c at that time was 6.8%, higher, but still at goal. - At last visit, sugars appeared to be fluctuating within the upper half of the target range overnight, initially elevated at bedtime and then improving in the second half of the night and they were variable after breakfast and lunch, with only occasional hyperglycemic spikes after these meals.  However, after dinner, blood sugars were almost 100% elevated above the 180 mg/dL target.  He was entering carbohydrates in a staggering fashion, especially for dinner, and we discussed about trying to enter these at the beginning of the meal, if possible.  He was doing a better job using Humalog  15 minutes before meal but he was not always able to do so.  We did discuss about possibly using Lyumjev  and I sent a prescription for this to his pharmacy to see if he  can obtain it.  I also recommended to strengthen his insulin  to carb ratio for dinner and overnight as he was occasionally eating out with friends at night.  I also recommended to enter 40-50% of the protein amount of carbs into the pump if he was eating a low carb or high-protein meal.  He was not aware of this.  I recommended to try to eliminate McDonald's food but if he did have high calorie meals to start with green vegetables.  He was snowboarding and we discussed about expecting low blood sugar during the night in this situation.  I also advised him about strategies to avoid this by increasing the target overnight, switching to sleep mode, and continuing to use a higher carb meal before going to bed in that situation.  I also recommended a refresher in carb counting.  We discussed about phone applications that could help with this and also recommended a reference book. CGM interpretation: -At today's visit, we reviewed his CGM downloads: It appears that *** of values are in target range (goal >70%), while *** are higher than 180 (goal <25%), and *** are lower than 70 (goal <4%).  The calculated average blood sugar is ***.  The projected HbA1c for the next 3 months (GMI) is ***. -Reviewing the CGM trends, *** - I suggested to: Patient Instructions  Please continue: - basal rates: 12 am: 1.05 units/h 2 am: 0.95 8 am: 1.15 - ICR:  12 am: 1:9 8 am: 1:9 2 pm: 1:7 6 pm: 1:6 11 PM: 1:9 - ISF: 12 am: 1:40 8 am: 1:30 11 PM: 1:40 - target: 12 am: 110  Try to frontload the carbs before meals. Use the Calorie King, My Fitness Pal. Also, use [employer]. Please read: Think Like a Pancreas  Please return in 3 to 4 months.  - we checked his HbA1c: 7%  -  advised to check sugars at different times of the day - 4x a day, rotating check times - advised for yearly eye exams >> he is UTD - will check annual labs today - return to clinic in 3-4 months  2. Goiter - Diagnosed when he was seeing Dr.  Heywood Louder as a child - TSH was normal: Lab Results  Component Value Date   TSH 2.40 11/21/2022  - No neck compression symptoms or thyroid  nodules felt on palpation of his neck - Continue to follow him expectantly and we will check a new level today  Emilie Harden, MD PhD [facility]

## 2023-12-07 ENCOUNTER — Ambulatory Visit (INDEPENDENT_AMBULATORY_CARE_PROVIDER_SITE_OTHER): Admitting: Internal Medicine

## 2023-12-07 ENCOUNTER — Encounter: Payer: Self-pay | Admitting: Internal Medicine

## 2023-12-07 VITALS — BP 110/70 | HR 70 | Ht 68.94 in | Wt 187.6 lb

## 2023-12-07 DIAGNOSIS — Z4681 Encounter for fitting and adjustment of insulin pump: Secondary | ICD-10-CM

## 2023-12-07 DIAGNOSIS — E1065 Type 1 diabetes mellitus with hyperglycemia: Secondary | ICD-10-CM | POA: Diagnosis not present

## 2023-12-07 LAB — POCT GLYCOSYLATED HEMOGLOBIN (HGB A1C): Hemoglobin A1C: 6.9 % — AB (ref 4.0–5.6)

## 2023-12-07 NOTE — Progress Notes (Signed)
 Patient ID: Ethan Acevedo, male   DOB: 2001/06/11, 23 y.o.   MRN: [id number] And this note was precharted 11/30/2023.  HPI: Ethan Acevedo is a 23 y.o.-year-old male, referred by his pediatric endocrinologist, Candee Cha, NP, for management of DM1, diagnosed in 2181 (46.23 years old), uncontrolled, without long term complications.  He is here with his mother.  Interim history: He denies increased urination, thirst, blurry vision, nausea.  Reviewed HbA1c levels: Lab Results  Component Value Date   HGBA1C 6.8 (A) 07/13/2023   HGBA1C 6.5 (A) 02/20/2023   HGBA1C 6.6 (A) 11/20/2022   HGBA1C 7.2 (A) 07/01/2022   HGBA1C 6.4 (A) 06/11/2021   HGBA1C 6.0 (A) 01/29/2021   HGBA1C 6.9 (A) 08/28/2020   HGBA1C 8.1 (A) 02/01/2020   HGBA1C 8.0 06/07/2019   HGBA1C 8.9 (A) 06/24/2018   HGBA1C 10.9 (A) 02/18/2018   HGBA1C 8.9 02/12/2017   HGBA1C 9.9 09/25/2016   HGBA1C 11.7% 04/09/2016   HGBA1C 10.1 09/26/2015   HGBA1C 11.6 07/04/2015   HGBA1C 9.3 02/12/2015   HGBA1C 8.9 11/02/2014   HGBA1C 11.6 07/11/2014   HGBA1C 10.8 03/16/2014   HGBA1C 11.1 (H) 11/24/2013   HGBA1C 10.6 11/24/2013   HGBA1C 10.0 08/31/2013   HGBA1C 8.3 04/21/2013   HGBA1C 8.1 02/07/2013   Insulin  pump:  - Medtronic started 2009 - nowT:slim X2 (replacement)  CGM: - Dexcom g6  Insulin : - Humalog  >> Lyumjev  (-Backup regimen with Basaglar )  Supplies: - [facility]  Pump settings -changes at last visit?  Goal: - basal rates: 12 am: 1.05 units/h 2 am: 0.95 8 am: 1.15 - ICR:  12 am: 1:12 >> 1:9 2 am: 1:12 (forgot to change) 8 am: 1:9 2 pm: 1:7 6 pm: 1:7 >> 1:6 11 PM: 1:10 >> 1:9 - ISF: 12 am: 1:40 8 am: 1:30 11 PM: 1:40 - target: 12 am: 150 >> 110 6 am: 120 >> 110 9 pm: 150 >> 110 - Insulin  on Board:  - bolus wizard: on - extended bolusing: not using - changes infusion site: q3 days TDD from basal insulin : 45% (27.41 units) >> 49% (28 units) TDD from bolus insulin : 55% (33.24 units) >> 51% (29  units) Total daily dose: 57-100 units  Meter: Contour next >> Accu-Chek  Pt checks his sugars >4x a day:  Previously:  Lowest sugar was 48 x1 (at work) >> 40s; he has hypoglycemia awareness at 70.  Does have a glucagon  kit at home. No previous hypoglycemia admission.  Highest sugar was 300s >> 400. + previous DKA admissions - 2: at dx and at 38-13 y/o.  Pt's meals are: - Breakfast: skip or McDonalds - Lunch: take out  - Dinner: home cooked most of the time, pizza - Snacks: no sweet drinks; PB crackers  - no CKD, last BUN/creatinine:  Lab Results  Component Value Date   BUN 13 11/21/2022   BUN 14 02/18/2018   CREATININE 1.05 11/21/2022   CREATININE 0.93 02/18/2018   Lab Results  Component Value Date   MICRALBCREAT 3 11/21/2022   MICRALBCREAT 4 01/29/2021   MICRALBCREAT 10 06/07/2019   MICRALBCREAT NOTE 02/18/2018   MICRALBCREAT 37 (H) 04/09/2016   MICRALBCREAT 4.6 02/12/2015   MICRALBCREAT 5.5 11/24/2013   MICRALBCREAT 13.6 05/29/2011   - last set of lipids: Lab Results  Component Value Date   CHOL 180 11/21/2022   HDL 69 11/21/2022   LDLCALC 96 11/21/2022   TRIG 64 11/21/2022   CHOLHDL 2.6 11/21/2022   - last eye exam was  11/07/2022: No DR.   - no numbness and tingling in his feet.   Last TSH: Lab Results  Component Value Date   TSH 2.40 11/21/2022   Pt has no FH of DM. MS and SLE in MGF.  He also has a history of ADHD.  He works at [employer].  ROS: + see HPI  Past Medical History:  Diagnosis Date   ADHD (attention deficit hyperactivity disorder)    Diabetic ketoacidosis, type I (HCC)    Goiter    Hypoglycemia associated with diabetes (HCC)    Physical growth delay    Type 1 diabetes mellitus in patient age 24-12 years with HbA1C goal below 8    No past surgical history on file. Social History   Socioeconomic History   Marital status: Single    Spouse name: Not on file   Number of children: Not on file   Years of education: Not on file    Highest education level: Not on file  Occupational History   Not on file  Tobacco Use   Smoking status: Never   Smokeless tobacco: Never  Substance and Sexual Activity   Alcohol use: Yes   Drug use: No   Sexual activity: Not on file  Other Topics Concern   Not on file  Social History Narrative   Lives with Mom, Dad, brother and sister and 2 dogs.  Graduated from [employer]   Social Drivers of Health   Financial Resource Strain: Not on file  Food Insecurity: Not on file  Transportation Needs: Not on file  Physical Activity: Not on file  Stress: Not on file  Social Connections: Not on file  Intimate Partner Violence: Not on file   Current Outpatient Medications on File Prior to Visit  Medication Sig Dispense Refill   Blood Glucose Monitoring Suppl (CONTOUR NEXT EZ) w/Device KIT 1 kit by Does not apply route daily as needed. 2 kit 5   Blood Glucose Monitoring Suppl (CONTOUR NEXT MONITOR) w/Device KIT Please specify directions, refills and quantity 1 kit 0   Continuous Glucose Sensor (DEXCOM G6 SENSOR) MISC APPLY 1 SENSOR EVERY 10 DAYS 9 each 3   Continuous Glucose Transmitter (DEXCOM G6 TRANSMITTER) MISC 1 each by Does not apply route every 3 (three) months. 1 each 0   Glucagon  (BAQSIMI  TWO PACK) 3 MG/DOSE POWD Place 1 kit into the nose as needed (for severe Hypoglycemia that he cannot eat ot drink). 2 each PRN   glucose blood (BAYER CONTOUR NEXT TEST) test strip Check BG 6x day 200 each 1   Insulin  Glargine (BASAGLAR  KWIKPEN) 100 UNIT/ML INJECT UP TO 50 UNITS PER DAY PER PROVIDER INSTRUCTIONS 45 mL 0   insulin  lispro (HUMALOG  KWIKPEN) 100 UNIT/ML KwikPen Use up to 50 units incase pump fails. 15 mL 1   insulin  lispro (HUMALOG ) 100 UNIT/ML injection USE 300 UNITS IN INSULIN  PUMP EVERY 48 HOURS 150 mL 3   Insulin  Lispro-aabc (LYUMJEV ) 100 UNIT/ML SOLN Use up to 300 units in the pump every 48h 10 mL 11   Insulin  Pen Needle 32G X 4 MM MISC Use with insulin  pens to administer insulin . 600  each 0   Lancets Misc. (ACCU-CHEK MULTICLIX LANCET DEV) KIT USE AS DIRECTED 1 each 0   Urine Glucose-Ketones Test STRP Use to check urine in cases of hyperglycemia 50 strip 6   No current facility-administered medications on file prior to visit.   Allergies  Allergen Reactions   Humalog  [Insulin  Lispro] Shortness Of Breath, Diarrhea and  Other (See Comments)    Headaches-    Family History  Problem Relation Age of Onset   Thyroid  disease Mother        Papillary thyroid  cancer   Cancer Mother    PE: BP 110/70 (BP Location: Right Arm, Patient Position: Sitting, Cuff Size: Normal)   Pulse 70   Ht 5' 8.94 (1.751 m)   Wt 187 lb 9.6 oz (85.1 kg)   SpO2 98%   BMI 27.75 kg/m  Wt Readings from Last 3 Encounters:  12/07/23 187 lb 9.6 oz (85.1 kg)  07/13/23 182 lb 9.6 oz (82.8 kg)  02/20/23 158 lb 3.2 oz (71.8 kg)   Constitutional: normal weight, in NAD Eyes:  EOMI, no exophthalmos ENT: no neck masses, + thyroid  palpable,  no cervical lymphadenopathy Cardiovascular: RRR, No MRG Respiratory: CTA B Musculoskeletal: no deformities Skin:no rashes Neurological: no tremor with outstretched hands  ASSESSMENT: 1. DM1, uncontrolled, without long-term complications, but with hyperglycemia - h/o DKA at diagnosis, in 2008  2. Goiter  PLAN:  1. Patient with longstanding, fairly well-controlled type 1 diabetes.  HbA1c at that time was 6.8%, higher, but still at goal. - At last visit, sugars appeared to be fluctuating within the upper half of the target range overnight, initially elevated at bedtime and then improving in the second half of the night and they were variable after breakfast and lunch, with only occasional hyperglycemic spikes after these meals.  However, after dinner, blood sugars were almost 100% elevated above the 180 mg/dL target.  He was entering carbohydrates in a staggering fashion, especially for dinner, and we discussed about trying to enter these at the beginning of the  meal, if possible.  He was doing a better job using Humalog  15 minutes before meal but he was not always able to do so.  We did discuss about possibly using Lyumjev  and I sent a prescription for this to his pharmacy to see if he can obtain it.  I also recommended to strengthen his insulin  to carb ratio for dinner and overnight as he was occasionally eating out with friends at night.  I also recommended to enter 40-50% of the protein amount of carbs into the pump if he was eating a low carb or high-protein meal.  He was not aware of this.  I recommended to try to eliminate McDonald's food but if he did have high calorie meals to start with green vegetables.  He was snowboarding and we discussed about expecting low blood sugar during the night in this situation.  I also advised him about strategies to avoid this by increasing the target overnight, switching to sleep mode, and continuing to use a higher carb meal before going to bed in that situation.  I also recommended a refresher in carb counting.  We discussed about phone applications that could help with this and also recommended a reference book. CGM interpretation: -At today's visit, we reviewed his CGM downloads: It appears that 62% of values are in target range (goal >70%), while 37.2% are higher than 180 (goal <25%), and 0.2% are lower than 70 (goal <4%).  The calculated average blood sugar is 174.  The projected HbA1c for the next 3 months (GMI) is 7.5%. -Reviewing the CGM trends, sugars are staying in the upper half of the target range during the night and they increase after meals, with more variability after lunch and particularly after dinner, after which he also blood sugars.  He is entering carbs into the pump  but continues to enter them in a staggered way, which we discussed is not ideal, especially if he does know what he plans to eat.  Even when he appears to be entering all of the carbs at the beginning of the meal, sugar still increased, show  at today's visit I advised him to strengthen all of his insulin  to carb ratios. We also discussed about doing extended boluses.  He is not doing that now, but we discussed that if he upgrade his pump program to the control IQ plus (version 7.9), he will be able to do extended boluses for fattier meals and to extended boluses over 8 hours.  We also discussed about starting the meal with protein, fat and fiber and ending with carbs to reduce the immediate postprandial peaks.  I did not suggest other changes in his regimen for now. Patient Instructions  Please continue: - basal rates: 12 am: 1.05 units/h 2 am: 0.95 8 am: 1.15 - ICR:  12 am: 1:9 >> 1.8  2 am: 1:12 >> 1:8 8 am: 1:9 >> 1:8 2 pm: 1:7 >> 1:6 6 pm: 1:6 >> 1:5 11 PM: 1:9 >> 1:8 - ISF: 12 am: 1:40 8 am: 1:30 11 PM: 1:40 - target: 12 am: 110  Try to frontload the carbs before meals.  Try to do dual wave boluses for fattier meal.  Please read: Think Like a Pancreas.  Please return in 3 to 4 months.  - we checked his HbA1c: 6.9% (slightly higher - advised to check sugars at different times of the day - 4x a day, rotating check times - advised for yearly eye exams >> he is not UTD - will check annual labs today - return to clinic in 3-4 months  2. Goiter - Diagnosed when he was seeing Dr. Heywood Louder as a child - Latest TSH was normal: Lab Results  Component Value Date   TSH 2.40 11/21/2022  - No neck compression symptoms or thyroid  nodules felt on palpation of his neck - Will recheck his TSH today and continue to follow him expectantly  Orders Placed This Encounter  Procedures   TSH   Microalbumin / creatinine urine ratio   Lipid Panel w/reflex Direct LDL   Comprehensive metabolic panel with GFR   Emilie Harden, MD PhD [facility]

## 2023-12-07 NOTE — Addendum Note (Signed)
 Addended by: Vernon Goodpasture on: 12/07/2023 01:32 PM   Modules accepted: Orders

## 2023-12-07 NOTE — Patient Instructions (Addendum)
 Please continue: - basal rates: 12 am: 1.05 units/h 2 am: 0.95 8 am: 1.15 - ICR:  12 am: 1:9 >> 1.8  2 am: 1:12 >> 1:8 8 am: 1:9 >> 1:8 2 pm: 1:7 >> 1:6 6 pm: 1:6 >> 1:5 11 PM: 1:9 >> 1:8 - ISF: 12 am: 1:40 8 am: 1:30 11 PM: 1:40 - target: 12 am: 110  Try to frontload the carbs before meals.  Try to do dual wave boluses for fattier meal.  Please read: Think Like a Pancreas.  Please return in 3 to 4 months.

## 2023-12-08 ENCOUNTER — Ambulatory Visit: Payer: Self-pay | Admitting: Internal Medicine

## 2023-12-08 LAB — LIPID PANEL W/REFLEX DIRECT LDL
Cholesterol: 171 mg/dL (ref <200)
HDL: 64 mg/dL (ref >=40)
LDL Cholesterol (Calc): 94 mg/dL
Non-HDL Cholesterol (Calc): 107 mg/dL (ref <130)
Total CHOL/HDL Ratio: 2.7 (calc) (ref <5.0)
Triglycerides: 45 mg/dL (ref <150)

## 2023-12-08 LAB — COMPREHENSIVE METABOLIC PANEL WITH GFR
AG Ratio: 2 (calc) (ref 1.0–2.5)
ALT: 15 U/L (ref 9–46)
AST: 14 U/L (ref 10–40)
Albumin: 4.6 g/dL (ref 3.6–5.1)
Alkaline phosphatase (APISO): 65 U/L (ref 36–130)
BUN: 15 mg/dL (ref 7–25)
CO2: 29 mmol/L (ref 20–32)
Calcium: 9.8 mg/dL (ref 8.6–10.3)
Chloride: 101 mmol/L (ref 98–110)
Creat: 0.89 mg/dL (ref 0.60–1.24)
Globulin: 2.3 g/dL (ref 1.9–3.7)
Glucose, Bld: 205 mg/dL — ABNORMAL HIGH (ref 65–99)
Potassium: 4.5 mmol/L (ref 3.5–5.3)
Sodium: 138 mmol/L (ref 135–146)
Total Bilirubin: 0.6 mg/dL (ref 0.2–1.2)
Total Protein: 6.9 g/dL (ref 6.1–8.1)
eGFR: 123 mL/min/{1.73_m2} (ref >=60)

## 2023-12-08 LAB — MICROALBUMIN / CREATININE URINE RATIO
Creatinine, Urine: 46 mg/dL (ref 20–320)
Microalb, Ur: <0.2 mg/dL

## 2023-12-08 LAB — TSH: TSH: 0.96 m[IU]/L (ref 0.40–4.50)

## 2024-01-06 ENCOUNTER — Other Ambulatory Visit: Payer: Self-pay | Admitting: Internal Medicine

## 2024-01-06 DIAGNOSIS — E1065 Type 1 diabetes mellitus with hyperglycemia: Secondary | ICD-10-CM

## 2024-03-14 ENCOUNTER — Ambulatory Visit (INDEPENDENT_AMBULATORY_CARE_PROVIDER_SITE_OTHER): Admitting: Internal Medicine

## 2024-03-14 ENCOUNTER — Encounter: Payer: Self-pay | Admitting: Internal Medicine

## 2024-03-14 VITALS — BP 120/80 | HR 94 | Ht 68.94 in | Wt 169.4 lb

## 2024-03-14 DIAGNOSIS — Z4681 Encounter for fitting and adjustment of insulin pump: Secondary | ICD-10-CM | POA: Diagnosis not present

## 2024-03-14 DIAGNOSIS — E1065 Type 1 diabetes mellitus with hyperglycemia: Secondary | ICD-10-CM

## 2024-03-14 LAB — POCT GLYCOSYLATED HEMOGLOBIN (HGB A1C): Hemoglobin A1C: 6.2 % — AB (ref 4.0–5.6)

## 2024-03-14 MED ORDER — DEXCOM G6 SENSOR MISC: 3 refills | Status: AC

## 2024-03-14 MED ORDER — INSULIN PEN NEEDLE 32G X 4 MM MISC: 3 refills | Status: AC

## 2024-03-14 MED ORDER — INSULIN GLARGINE-YFGN 100 UNIT/ML ~~LOC~~ SOLN: 26.0000 [IU] | Freq: Every day | SUBCUTANEOUS | 3 refills | Status: AC

## 2024-03-14 MED ORDER — INSULIN LISPRO (1 UNIT DIAL) 100 UNIT/ML (KWIKPEN)
PEN_INJECTOR | SUBCUTANEOUS | 1 refills | Status: AC
Start: 2024-03-14 — End: ?

## 2024-03-14 NOTE — Progress Notes (Signed)
 Patient ID: Ethan Acevedo, male   DOB: 05-21-2001, 23 y.o.   MRN: [id number]  HPI: Ethan Acevedo is a 23 y.o.-year-old male, initially referred by his pediatric endocrinologist, Jeannene Penton, NP, returning for follow-up for DM1, diagnosed in [birth year]83 (43.23 years old), uncontrolled, without long term complications.  He is here with his mother.  Last visit 3 months ago.  Interim history: He denies increased urination, thirst, blurry vision, nausea. Since last visit, he started to increase exercise and lost 18 pounds! He is preparing to go to [place] next month to see a football game.  Reviewed HbA1c levels: Lab Results  Component Value Date   HGBA1C 6.9 (A) 12/07/2023   HGBA1C 6.8 (A) 07/13/2023   HGBA1C 6.5 (A) 02/20/2023   HGBA1C 6.6 (A) 11/20/2022   HGBA1C 7.2 (A) 07/01/2022   HGBA1C 6.4 (A) 06/11/2021   HGBA1C 6.0 (A) 01/29/2021   HGBA1C 6.9 (A) 08/28/2020   HGBA1C 8.1 (A) 02/01/2020   HGBA1C 8.0 06/07/2019   HGBA1C 8.9 (A) 06/24/2018   HGBA1C 10.9 (A) 02/18/2018   HGBA1C 8.9 02/12/2017   HGBA1C 9.9 09/25/2016   HGBA1C 11.7% 04/09/2016   HGBA1C 10.1 09/26/2015   HGBA1C 11.6 07/04/2015   HGBA1C 9.3 02/12/2015   HGBA1C 8.9 11/02/2014   HGBA1C 11.6 07/11/2014   HGBA1C 10.8 03/16/2014   HGBA1C 11.1 (H) 11/24/2013   HGBA1C 10.6 11/24/2013   HGBA1C 10.0 08/31/2013   HGBA1C 8.3 04/21/2013   Insulin  pump:  - Medtronic started 2009 - nowT:slim X2 (replacement)  CGM: - Dexcom  -would like to continue with the G6 sensor  Insulin : - Humalog  (-Backup regimen with Basaglar )  Supplies: - [facility]  Pump settings: - basal rates: 12 am: 1.05 units/h 2 am: 0.95 8 am: 1.15 - ICR:  12 am: 1:9 >> 1.8  2 am: 1:12 >> 1:8 8 am: 1:9 >> 1:8 2 pm: 1:7 >> 1:6 6 pm: 1:6 >> 1:5 11 PM: 1:9 >> 1:8 - ISF: 12 am: 1:40 8 am: 1:30 11 PM: 1:40 - target: 12 am: 110 - bolus wizard: on - extended bolusing: not using - changes infusion site: q3 days TDD from basal insulin : 45% (27.41  units) >> 49% (28 units) TDD from bolus insulin : 55% (33.24 units) >> 51% (29 units) Total daily dose: 57-100 units  Meter: Contour next >> Accu-Chek  Pt checks his sugars >4x a day:  Previously:  Previously:  Lowest sugar was 48 x1 (at work) >> 40s; he has hypoglycemia awareness at 70.  Does have a glucagon  kit at home. No previous hypoglycemia admission.  Highest sugar was 300s >> 400. + previous DKA admissions - 2: at dx and at 69-13 y/o.  Pt's meals are: - Breakfast: skip or McDonalds - Lunch: take out  - Dinner: home cooked most of the time, pizza - Snacks: no sweet drinks; PB crackers  - no CKD, last BUN/creatinine:  Lab Results  Component Value Date   BUN 15 12/07/2023   BUN 13 11/21/2022   CREATININE 0.89 12/07/2023   CREATININE 1.05 11/21/2022   Lab Results  Component Value Date   MICRALBCREAT NOTE 12/07/2023   MICRALBCREAT 3 11/21/2022   MICRALBCREAT 4 01/29/2021   MICRALBCREAT 10 06/07/2019   MICRALBCREAT NOTE 02/18/2018   MICRALBCREAT 37 (H) 04/09/2016   MICRALBCREAT 4.6 02/12/2015   MICRALBCREAT 5.5 11/24/2013   MICRALBCREAT 13.6 05/29/2011   - last set of lipids: Lab Results  Component Value Date   CHOL 171 12/07/2023   HDL 64  12/07/2023   LDLCALC 94 12/07/2023   TRIG 45 12/07/2023   CHOLHDL 2.7 12/07/2023   - last eye exam was 11/07/2022: No DR.   - no numbness and tingling in his feet.   Last TSH: Lab Results  Component Value Date   TSH 0.96 12/07/2023   Pt has no FH of DM. MS and SLE in MGF.  He also has a history of ADHD.  He works at [employer].  ROS: + see HPI  Past Medical History:  Diagnosis Date   ADHD (attention deficit hyperactivity disorder)    Diabetic ketoacidosis, type I (HCC)    Goiter    Hypoglycemia associated with diabetes (HCC)    Physical growth delay    Type 1 diabetes mellitus in patient age 53-12 years with HbA1C goal below 8    No past surgical history on file. Social History   Socioeconomic History    Marital status: Single    Spouse name: Not on file   Number of children: Not on file   Years of education: Not on file   Highest education level: Not on file  Occupational History   Not on file  Tobacco Use   Smoking status: Never   Smokeless tobacco: Never  Substance and Sexual Activity   Alcohol use: Yes   Drug use: No   Sexual activity: Not on file  Other Topics Concern   Not on file  Social History Narrative   Lives with Mom, Dad, brother and sister and 2 dogs.  Graduated from [employer]   Social Drivers of Health   Financial Resource Strain: Not on file  Food Insecurity: Not on file  Transportation Needs: Not on file  Physical Activity: Not on file  Stress: Not on file  Social Connections: Not on file  Intimate Partner Violence: Not on file   Current Outpatient Medications on File Prior to Visit  Medication Sig Dispense Refill   Blood Glucose Monitoring Suppl (CONTOUR NEXT EZ) w/Device KIT 1 kit by Does not apply route daily as needed. 2 kit 5   Blood Glucose Monitoring Suppl (CONTOUR NEXT MONITOR) w/Device KIT Please specify directions, refills and quantity 1 kit 0   Continuous Glucose Sensor (DEXCOM G6 SENSOR) MISC APPLY 1 SENSOR EVERY 10 DAYS 9 each 3   Continuous Glucose Transmitter (DEXCOM G6 TRANSMITTER) MISC USE 1 EACH BY DOES NOT APPLY ROUTE EVERY 3 (THREE) MONTHS. 1 each 3   Glucagon  (BAQSIMI  TWO PACK) 3 MG/DOSE POWD Place 1 kit into the nose as needed (for severe Hypoglycemia that he cannot eat ot drink). 2 each PRN   glucose blood (BAYER CONTOUR NEXT TEST) test strip Check BG 6x day 200 each 1   Insulin  Glargine (BASAGLAR  KWIKPEN) 100 UNIT/ML INJECT UP TO 50 UNITS PER DAY PER PROVIDER INSTRUCTIONS 45 mL 0   insulin  lispro (HUMALOG  KWIKPEN) 100 UNIT/ML KwikPen Use up to 50 units incase pump fails. 15 mL 1   insulin  lispro (HUMALOG ) 100 UNIT/ML injection USE 300 UNITS IN INSULIN  PUMP EVERY 48 HOURS 150 mL 3   Insulin  Lispro-aabc (LYUMJEV ) 100 UNIT/ML SOLN Use up  to 300 units in the pump every 48h 10 mL 11   Insulin  Pen Needle 32G X 4 MM MISC Use with insulin  pens to administer insulin . 600 each 0   Lancets Misc. (ACCU-CHEK MULTICLIX LANCET DEV) KIT USE AS DIRECTED 1 each 0   Urine Glucose-Ketones Test STRP Use to check urine in cases of hyperglycemia 50 strip 6   No  current facility-administered medications on file prior to visit.   Allergies  Allergen Reactions   Humalog  [Insulin  Lispro] Shortness Of Breath, Diarrhea and Other (See Comments)    Headaches-    Family History  Problem Relation Age of Onset   Thyroid  disease Mother        Papillary thyroid  cancer   Cancer Mother    PE: BP 120/80   Pulse 94   Ht 5' 8.94 (1.751 m)   Wt 169 lb 6.4 oz (76.8 kg)   SpO2 97%   BMI 25.06 kg/m  Wt Readings from Last 3 Encounters:  03/14/24 169 lb 6.4 oz (76.8 kg)  12/07/23 187 lb 9.6 oz (85.1 kg)  07/13/23 182 lb 9.6 oz (82.8 kg)   Constitutional: normal weight, in NAD Eyes:  EOMI, no exophthalmos ENT: no neck masses, + thyroid  mildly palpable,  no cervical lymphadenopathy Cardiovascular: Tachycardia, RR, No MRG Respiratory: CTA B Musculoskeletal: no deformities Skin:no rashes Neurological: no tremor with outstretched hands Diabetic Foot Exam - Simple   Simple Foot Form Diabetic Foot exam was performed with the following findings: Yes 03/14/2024  2:03 PM  Visual Inspection No deformities, no ulcerations, no other skin breakdown bilaterally: Yes Sensation Testing Intact to touch and monofilament testing bilaterally: Yes Pulse Check Posterior Tibialis and Dorsalis pulse intact bilaterally: Yes Comments    ASSESSMENT: 1. DM1, uncontrolled, without long-term complications, but with hyperglycemia - h/o DKA at diagnosis, in 2008  2. Goiter  PLAN:  1. Patient with longstanding, fairly well-controlled type 1 diabetes, on the t:slim X2 insulin  pump integrated with a Dexcom CGM.  At last visit, HbA1c was higher, but still at goal, at  6.9%.  At last visit, reviewing the CGM trends, sugars were staying in the upper half of the target range during the night, and they were increasing after meals, with more variability after lunch and particularly after dinner.  He was entering carbs into the pump in a staggered way, which we discussed was not ideal, especially if he did  know what he was planning to eat.  Even when he appears to be entering all the carbs at the beginning of the meal, sugars were still high after the meals.  Therefore, I advised him to strengthen his insulin  to carb ratio.  I also recommended extended boluses.  I recommended to upgrade his pump program to the control IQ plus (version 7.9).  Discussed that after doing so, he could extend his boluses before 8 hours.  We also discussed about starting the meal with protein, fat, and fiber and ending it with carbs, to reduce the immediate postprandial peaks.  I again recommended a reference book for diabetes type 1 management. CGM interpretation: -At today's visit, we reviewed his CGM downloads: It appears that 67% of values are in target range (goal >70%), while 31.3% are higher than 180 (goal <25%), and 1.4% are lower than 70 (goal <4%).  The calculated average blood sugar is 157.  The projected HbA1c for the next 3 months (GMI) is 7.1%. -Reviewing the CGM trends, sugars appear to be slightly higher than expected from his HbA1c, increasing after breakfast and then also after dinner.  The breakfast increase in blood sugars occurs even if he enters the appropriate amount of carbs and starts to bolus before this meal.  Therefore, I recommended to strengthen insulin  to carb ratio for breakfast.  Regarding dinner, he is not entering carbs appropriately before this meal.  He is not entering between 0-15 grams of  carbs for dinner, which, we discussed, is not enough.  I did advise him to try his best to enter more carbs into the pump at the beginning of the meal.  I again underlined the need  to do doorway boluses for fattier meals.  Otherwise, I did not recommend a change in the rest of the settings.  -For his upcoming trip to [place], we discussed about making sure that he has not only his pump supplies, but also backup insulin  pens for basal and bolus injections.  I sent prescription for Semglee , Humalog  pens, and pen needles to his pharmacy and discussed with the patient and his mother how to use them. -I advised him to: Patient Instructions  Please continue: - basal rates: 12 am: 1.05 units/h 2 am: 0.95 8 am: 1.15 - ICR:  12 am: 1:8  4 am: 1:8 >> 1:6 2 pm: 1:6 6 pm: 1:5 11 PM: 1:8 - ISF: 12 am: 1:40 8 am: 1:30 11 PM: 1:40 - target: 12 am: 110  Try to frontload the carbs before meals.  Try to do dual wave boluses for fattier meal.  Please return in 3 to 4 months.  - we checked his HbA1c: 6.2% (lower) - advised to check sugars at different times of the day - 4x a day, rotating check times - advised for yearly eye exams >> he is UTD - return to clinic in 3-4 months for  2. Goiter - Diagnosed when he was seeing Dr. Hershal as a child - Latest TSH was normal: Lab Results  Component Value Date   TSH 0.96 12/07/2023  -No neck compression symptoms or thyroid  nodules felt on palpation of his neck today - Will continue to follow him expectantly  Lela Fendt, MD PhD [facility]

## 2024-03-14 NOTE — Patient Instructions (Addendum)
 Please continue: - basal rates: 12 am: 1.05 units/h 2 am: 0.95 8 am: 1.15 - ICR:  12 am: 1:8  4 am: 1:8 >> 1:6 2 pm: 1:6 6 pm: 1:5 11 PM: 1:8 - ISF: 12 am: 1:40 8 am: 1:30 11 PM: 1:40 - target: 12 am: 110  Try to frontload the carbs before meals.  Try to do dual wave boluses for fattier meal.  Please return in 3 to 4 months.

## 2024-05-30 ENCOUNTER — Telehealth: Payer: Self-pay | Admitting: Internal Medicine

## 2024-05-30 NOTE — Progress Notes (Unsigned)
 Patient ID: [name], male   DOB: Apr 12, 2001, 23 y.o.   MRN: [id number]  Patient location: Home My location: Office Persons participating in the virtual visit: patient, provider  Referring Provider: Pcp, No  I connected with the patient on 05/30/24 at  11:32 AM EST by a video enabled telemedicine application and verified that I am speaking with the correct person.   I discussed the limitations of evaluation and management by telemedicine and the availability of in person appointments. The patient expressed understanding and agreed to proceed.   Details of the encounter are shown below.  HPI: Ethan Acevedo is a 23 y.o.-year-old male, 23-initially referred by his pediatric endocrinologist, Ethan Penton, NP, returning for follow-up for DM1, diagnosed in 23 (23.23 years old), uncontrolled, without long term complications.  He is here with his mother.  Last visit 3 months ago.  Interim history: He denies increased urination, thirst, blurry vision, nausea. Before last visit, he lost 18 pounds through increased exercise! He went to [place] since last OV to see a football game.  Reviewed HbA1c levels: Lab Results  Component Value Date   HGBA1C 6.2 (A) 03/14/2024   HGBA1C 6.9 (A) 12/07/2023   HGBA1C 6.8 (A) 07/13/2023   HGBA1C 6.5 (A) 02/20/2023   HGBA1C 6.6 (A) 11/20/2022   HGBA1C 7.2 (A) 07/01/2022   HGBA1C 6.4 (A) 06/11/2021   HGBA1C 6.0 (A) 01/29/2021   HGBA1C 6.9 (A) 08/28/2020   HGBA1C 8.1 (A) 02/01/2020   HGBA1C 8.0 06/07/2019   HGBA1C 8.9 (A) 06/24/2018   HGBA1C 10.9 (A) 02/18/2018   HGBA1C 8.9 02/12/2017   HGBA1C 9.9 09/25/2016   HGBA1C 11.7% 04/09/2016   HGBA1C 10.1 09/26/2015   HGBA1C 11.6 07/04/2015   HGBA1C 9.3 02/12/2015   HGBA1C 8.9 11/02/2014   HGBA1C 11.6 07/11/2014   HGBA1C 10.8 03/16/2014   HGBA1C 11.1 (H) 11/24/2013   HGBA1C 10.6 11/24/2013   HGBA1C 10.0 08/31/2013   Insulin  pump:  - Medtronic started 2009 - nowT:slim X2 (replacement)  CGM: -  Dexcom  -would like to continue with the G6 sensor  Insulin : - Humalog  (-Backup regimen with Basaglar )  Supplies: - [facility]  Pump settings: - basal rates: 12 am: 1.05 units/h 2 am: 0.95 8 am: 1.15 - ICR:  12 am: 1:8  4 am: 1:8 >> 1:6 2 pm: 1:6 6 pm: 1:5 11 PM: 1:8 - ISF: 12 am: 1:40 8 am: 1:30 11 PM: 1:40 - target: 12 am: 110 - bolus wizard: on - extended bolusing: not using - changes infusion site: q3 days TDD from basal insulin : 45% (27.41 units) >> 49% (28 units) TDD from bolus insulin : 55% (33.24 units) >> 51% (29 units) Total daily dose: 57-100 units  Meter: Contour next >> Accu-Chek  Pt checks his sugars >4x a day: - am: - 2h after b'fast: - lunch: - 2h after lunch: - dinner: - 2h after dinner: Normal - bedtime:  Previously:  Previously:  Lowest sugar was 48 x1 (at work) >> 40s; he has hypoglycemia awareness at 70.  Does have a glucagon  kit at home. No previous hypoglycemia admission.  Highest sugar was 300s >> 400. + previous DKA admissions - 2: at dx and at 80-23 y/o.  Pt's meals are: - Breakfast: skip or McDonalds - Lunch: take out  - Dinner: home cooked most of the time, pizza - Snacks: no sweet drinks; PB crackers  - no CKD, last BUN/creatinine:  Lab Results  Component Value Date   BUN 15 12/07/2023  BUN 13 11/21/2022   CREATININE 0.89 12/07/2023   CREATININE 1.05 11/21/2022   Lab Results  Component Value Date   MICRALBCREAT NOTE 12/07/2023   MICRALBCREAT 3 11/21/2022   MICRALBCREAT 4 01/29/2021   MICRALBCREAT 10 06/07/2019   MICRALBCREAT NOTE 02/18/2018   MICRALBCREAT 37 (H) 04/09/2016   MICRALBCREAT 4.6 02/12/2015   MICRALBCREAT 5.5 11/24/2013   MICRALBCREAT 13.6 05/29/2011   - last set of lipids: Lab Results  Component Value Date   CHOL 171 12/07/2023   HDL 64 12/07/2023   LDLCALC 94 12/07/2023   TRIG 45 12/07/2023   CHOLHDL 2.7 12/07/2023   - last eye exam was 11/07/2022: No DR.   - no numbness and tingling in  his feet.   Last TSH: Lab Results  Component Value Date   TSH 0.96 12/07/2023   Pt has no FH of DM. MS and SLE in MGF.  He also has a history of ADHD.  He works at [employer].  ROS: + see HPI  Past Medical History:  Diagnosis Date   ADHD (attention deficit hyperactivity disorder)    Diabetic ketoacidosis, type I (HCC)    Goiter    Hypoglycemia associated with diabetes (HCC)    Physical growth delay    Type 1 diabetes mellitus in patient age 23-12 years with HbA1C goal below 8    No past surgical history on file. Social History   Socioeconomic History   Marital status: Single    Spouse name: Not on file   Number of children: Not on file   Years of education: Not on file   Highest education level: Not on file  Occupational History   Not on file  Tobacco Use   Smoking status: Never   Smokeless tobacco: Never  Substance and Sexual Activity   Alcohol use: Yes   Drug use: No   Sexual activity: Not on file  Other Topics Concern   Not on file  Social History Narrative   Lives with Mom, Dad, brother and sister and 2 dogs.  Graduated from [employer]   Social Drivers of Health   Financial Resource Strain: Not on file  Food Insecurity: Not on file  Transportation Needs: Not on file  Physical Activity: Not on file  Stress: Not on file  Social Connections: Not on file  Intimate Partner Violence: Not on file   Current Outpatient Medications on File Prior to Visit  Medication Sig Dispense Refill   Blood Glucose Monitoring Suppl (CONTOUR NEXT EZ) w/Device KIT 1 kit by Does not apply route daily as needed. 2 kit 5   Blood Glucose Monitoring Suppl (CONTOUR NEXT MONITOR) w/Device KIT Please specify directions, refills and quantity 1 kit 0   Continuous Glucose Sensor (DEXCOM G6 SENSOR) MISC APPLY 1 SENSOR EVERY 10 DAYS 9 each 3   Continuous Glucose Transmitter (DEXCOM G6 TRANSMITTER) MISC USE 1 EACH BY DOES NOT APPLY ROUTE EVERY 3 (THREE) MONTHS. 1 each 3   Glucagon  (BAQSIMI  TWO  PACK) 3 MG/DOSE POWD Place 1 kit into the nose as needed (for severe Hypoglycemia that he cannot eat ot drink). 2 each PRN   glucose blood (BAYER CONTOUR NEXT TEST) test strip Check BG 6x day 200 each 1   insulin  glargine-yfgn (SEMGLEE , YFGN,) 100 UNIT/ML injection Inject 0.26 mLs (26 Units total) into the skin at bedtime. 15 mL 3   insulin  lispro (HUMALOG  KWIKPEN) 100 UNIT/ML KwikPen Use up to 50 units incase pump fails. 15 mL 1   insulin  lispro (HUMALOG )  100 UNIT/ML injection USE 300 UNITS IN INSULIN  PUMP EVERY 48 HOURS 150 mL 3   Insulin  Lispro-aabc (LYUMJEV ) 100 UNIT/ML SOLN Use up to 300 units in the pump every 48h 10 mL 11   Insulin  Pen Needle 32G X 4 MM MISC Use with insulin  pens to administer insulin . 200 each 3   Lancets Misc. (ACCU-CHEK MULTICLIX LANCET DEV) KIT USE AS DIRECTED 1 each 0   Urine Glucose-Ketones Test STRP Use to check urine in cases of hyperglycemia 50 strip 6   No current facility-administered medications on file prior to visit.   Allergies  Allergen Reactions   Humalog  [Insulin  Lispro] Shortness Of Breath, Diarrhea and Other (See Comments)    Headaches-    Family History  Problem Relation Age of Onset   Thyroid  disease Mother        Papillary thyroid  cancer   Cancer Mother    PE: There were no vitals taken for this visit. Wt Readings from Last 3 Encounters:  03/14/24 169 lb 6.4 oz (76.8 kg)  12/07/23 187 lb 9.6 oz (85.1 kg)  07/13/23 182 lb 9.6 oz (82.8 kg)   Constitutional:  in NAD  The physical exam was not performed (virtual visit).  ASSESSMENT: 1. DM1, uncontrolled, without long-term complications, but with hyperglycemia - h/o DKA at diagnosis, in 2008  2. Goiter  PLAN:  1. Patient with longstanding, fairly well-controlled type 1 diabetes, on the t:slim X2 insulin  pump integrated with a Dexcom CGM.  At last visit HbA1c was lower than previously, at 6.2%.  However, sugars appears to be slightly higher than expected from his HbA1c, increasing  after breakfast and then also after dinner.  The breakfast increase in blood sugars was occurring even if he was entering the appropriate amount of carbs and starting bolusing before the meal.  Therefore, I recommended to strengthened the insulin  to carb ratio for breakfast.  Regarding dinner, he was not entering carbs appropriately for this meal, only between 0 to 15 g of carbs, which we discussed, was not enough.  I advised him to try his best to enter all of the carbs into the pump at the beginning of the meal.  I again underlined the need to do dual wave boluses with fattier meals.  We did not change his regimen otherwise.  He was planning to travel to [place] and we discussed about a backup regimen  in case his pump failed. - I previously recommended a reference book for diabetes type 1 management. CGM interpretation: -At today's visit, we reviewed his CGM downloads: It appears that *** of values are in target range (goal >70%), while *** are higher than 180 (goal <25%), and *** are lower than 70 (goal <4%).  The calculated average blood sugar is ***.  The projected HbA1c for the next 3 months (GMI) is ***. -Reviewing the CGM trends, ***  -)I advised him to: Patient Instructions  Please continue: - basal rates: 12 am: 1.05 units/h 2 am: 0.95 8 am: 1.15 - ICR:  12 am: 1:8  4 am: 1:6 2 pm: 1:6 6 pm: 1:5 11 PM: 1:8 - ISF: 12 am: 1:40 8 am: 1:30 11 PM: 1:40 - target: 12 am: 110  Try to frontload the carbs before meals. Try to do dual wave boluses for fattier meal.  Please return in 3 to 4 months.  - we checked his HbA1c: 7%  - advised to check sugars at different times of the day - 4x a day, rotating check times -  advised for yearly eye exams >> he is UTD - return to clinic in 3-4 months  2. Goiter - Diagnosed when he was seeing Dr. Hershal as a child - Latest TSH was normal: Lab Results  Component Value Date   TSH 0.96 12/07/2023  - He denies neck compression symptoms -  Will continue to follow him expectantly  Ethan Fendt, MD PhD [facility]

## 2024-06-02 ENCOUNTER — Encounter: Payer: Self-pay | Admitting: Internal Medicine

## 2024-06-20 ENCOUNTER — Ambulatory Visit: Payer: Self-pay

## 2024-06-20 ENCOUNTER — Encounter: Payer: Self-pay | Admitting: Internal Medicine

## 2024-06-20 ENCOUNTER — Ambulatory Visit: Admitting: Internal Medicine

## 2024-06-20 VITALS — BP 120/60 | HR 104 | Ht 68.94 in | Wt 158.4 lb

## 2024-06-20 DIAGNOSIS — E049 Nontoxic goiter, unspecified: Secondary | ICD-10-CM

## 2024-06-20 DIAGNOSIS — Z9641 Presence of insulin pump (external) (internal): Secondary | ICD-10-CM

## 2024-06-20 DIAGNOSIS — E1065 Type 1 diabetes mellitus with hyperglycemia: Secondary | ICD-10-CM

## 2024-06-20 LAB — POCT GLYCOSYLATED HEMOGLOBIN (HGB A1C): Hemoglobin A1C: 6.4 % — AB (ref 4.0–5.6)

## 2024-06-20 NOTE — Patient Instructions (Addendum)
 Please continue: - basal rates: 12 am: 1.05 units/h 2 am: 0.95 8 am: 1.15 - ICR:             12 am: 1:8             4 am: 1:6 2 pm: 1:6 6 pm: 1:5 11 PM: 1:8 - ISF: 12 am: 1:40 8 am: 1:30 11 PM: 1:40 - target: 12 am: 110  Log into Clarity app and grant us  permission to see the CGM traces: [id number].   Please return in 4-6 months.

## 2024-06-20 NOTE — Progress Notes (Signed)
 Patient ID: Ethan Acevedo, male   DOB: [birth year], 23 y.o.   MRN: [id number] The note was precharted 05/30/2024.  HPI: Ethan Acevedo is a 23 y.o.-year-old male, initially referred by his pediatric endocrinologist, Jeannene Penton, NP, returning for follow-up for DM1, diagnosed in 6059 (23 years old), uncontrolled, without long term complications.  He is here with his mother.  Last visit 3.5 months ago.  Interim history: He denies increased urination, thirst, blurry vision, nausea. Before last visit, he lost 18 pounds through increased exercise! He is walking a lot.  Since last visit, he lost another approximately 11 pounds. He continues to change hs diet: whole foods - a lot of veggies, some cheese, less processed foods. No sweet drinks. Drinks diet sodas.   Reviewed HbA1c levels: Lab Results  Component Value Date   HGBA1C 6.2 (A) 03/14/2024   HGBA1C 6.9 (A) 12/07/2023   HGBA1C 6.8 (A) 07/13/2023   HGBA1C 6.5 (A) 02/20/2023   HGBA1C 6.6 (A) 11/20/2022   HGBA1C 7.2 (A) 07/01/2022   HGBA1C 6.4 (A) 06/11/2021   HGBA1C 6.0 (A) 01/29/2021   HGBA1C 6.9 (A) 08/28/2020   HGBA1C 8.1 (A) 02/01/2020   HGBA1C 8.0 06/07/2019   HGBA1C 8.9 (A) 06/24/2018   HGBA1C 10.9 (A) 02/18/2018   HGBA1C 8.9 02/12/2017   HGBA1C 9.9 09/25/2016   HGBA1C 11.7% 04/09/2016   HGBA1C 10.1 09/26/2015   HGBA1C 11.6 07/04/2015   HGBA1C 9.3 02/12/2015   HGBA1C 8.9 11/02/2014   HGBA1C 11.6 07/11/2014   HGBA1C 10.8 03/16/2014   HGBA1C 11.1 (H) 11/24/2013   HGBA1C 10.6 11/24/2013   HGBA1C 10.0 08/31/2013   Insulin  pump:  - [employer] started 2009 - nowT:slim X2 (replacement)  CGM: - Dexcom  -would like to continue with the G6 sensor  Insulin : - Humalog  (-Backup regimen with Basaglar )  Supplies: - [facility]  Pump settings: - basal rates: 12 am: 1.05 units/h 2 am: 0.95 8 am: 1.15 - ICR:  12 am: 1:8  4 am: 1:8 >> 1:6 2 pm: 1:6 6 pm: 1:5 11 PM: 1:8 - ISF: 12 am: 1:40 8 am: 1:30 11 PM: 1:40 -  target: 12 am: 110 - bolus wizard: on - extended bolusing: not using - changes infusion site: q3 days TDD from basal insulin : 45% (27.41 units) >> 49% (28 units) >> 59% (22 units) TDD from bolus insulin : 55% (33.24 units) >> 51% (29 units) >> 31% (10 units) Total daily dose: 57-100 units  Meter: Contour next >> Accu-Chek  Pt checks his sugars >4x a day:      Previously:  Previously:  Lowest sugar was 48 x1 (at work) >> 40s >> 60s; he has hypoglycemia awareness at 70.  Does have a glucagon  kit at home. No previous hypoglycemia admission.  Highest sugar was 300s >> 400 >> 280s. + previous DKA admissions - 2: at dx and at 1-13 y/o.  Pt's meals are: - Breakfast: skip or McDonal[employer] - Lunch: take out  - Dinner: home cooked most of the time, pizza - Snacks: no sweet drinks; PB crackers  - no CKD, last BUN/creatinine:  Lab Results  Component Value Date   BUN 15 12/07/2023   BUN 13 11/21/2022   CREATININE 0.89 12/07/2023   CREATININE 1.05 11/21/2022   Lab Results  Component Value Date   MICRALBCREAT NOTE 12/07/2023   MICRALBCREAT 3 11/21/2022   MICRALBCREAT 4 01/29/2021   MICRALBCREAT 10 06/07/2019   MICRALBCREAT NOTE 02/18/2018   MICRALBCREAT 37 (H) 04/09/2016   MICRALBCREAT 4.6  02/12/2015   MICRALBCREAT 5.5 11/24/2013   MICRALBCREAT 13.6 05/29/2011   - last set of lipids: Lab Results  Component Value Date   CHOL 171 12/07/2023   HDL 64 12/07/2023   LDLCALC 94 12/07/2023   TRIG 45 12/07/2023   CHOLHDL 2.7 12/07/2023   - last eye exam was 11/07/2022: No DR.   - no numbness and tingling in his feet.  Last foot exam: 03/14/2024.  Last TSH: Lab Results  Component Value Date   TSH 0.96 12/07/2023   Pt has no FH of DM. MS and SLE in MGF.  He also has a history of ADHD.  He works at [employer].  ROS: + see HPI  Past Medical History:  Diagnosis Date   ADHD (attention deficit hyperactivity disorder)    Diabetic ketoacidosis, type I (HCC)    Goiter     Hypoglycemia associated with diabetes (HCC)    Physical growth delay    Type 1 diabetes mellitus in patient age 23-12 years with HbA1C goal below 8    No past surgical history on file. Social History   Socioeconomic History   Marital status: Single    Spouse name: Not on file   Number of children: Not on file   Years of education: Not on file   Highest education level: Not on file  Occupational History   Not on file  Tobacco Use   Smoking status: Never   Smokeless tobacco: Never  Substance and Sexual Activity   Alcohol use: Yes   Drug use: No   Sexual activity: Not on file  Other Topics Concern   Not on file  Social History Narrative   Lives with Mom, Dad, brother and sister and 2 dogs.  Graduated from [employer]   Social Drivers of Health   Tobacco Use: Low Risk (06/02/2024)   Patient History    Smoking Tobacco Use: Never    Smokeless Tobacco Use: Never    Passive Exposure: Not on file  Financial Resource Strain: Not on file  Food Insecurity: Not on file  Transportation Needs: Not on file  Physical Activity: Not on file  Stress: Not on file  Social Connections: Not on file  Intimate Partner Violence: Not on file  Depression (EYV7-0): Not on file  Alcohol Screen: Not on file  Housing: Not on file  Utilities: Not on file  Health Literacy: Not on file   Current Outpatient Medications on File Prior to Visit  Medication Sig Dispense Refill   Blood Glucose Monitoring Suppl (CONTOUR NEXT EZ) w/Device KIT 1 kit by Does not apply route daily as needed. 2 kit 5   Blood Glucose Monitoring Suppl (CONTOUR NEXT MONITOR) w/Device KIT Please specify directions, refills and quantity 1 kit 0   Continuous Glucose Sensor (DEXCOM G6 SENSOR) MISC APPLY 1 SENSOR EVERY 10 DAYS 9 each 3   Continuous Glucose Transmitter (DEXCOM G6 TRANSMITTER) MISC USE 1 EACH BY DOES NOT APPLY ROUTE EVERY 3 (THREE) MONTHS. 1 each 3   Glucagon  (BAQSIMI  TWO PACK) 3 MG/DOSE POWD Place 1 kit into the nose as  needed (for severe Hypoglycemia that he cannot eat ot drink). 2 each PRN   glucose blood (BAYER CONTOUR NEXT TEST) test strip Check BG 6x day 200 each 1   insulin  glargine-yfgn (SEMGLEE , YFGN,) 100 UNIT/ML injection Inject 0.26 mLs (26 Units total) into the skin at bedtime. 15 mL 3   insulin  lispro (HUMALOG  KWIKPEN) 100 UNIT/ML KwikPen Use up to 50 units incase pump fails.  15 mL 1   insulin  lispro (HUMALOG ) 100 UNIT/ML injection USE 300 UNITS IN INSULIN  PUMP EVERY 48 HOURS 150 mL 3   Insulin  Lispro-aabc (LYUMJEV ) 100 UNIT/ML SOLN Use up to 300 units in the pump every 48h 10 mL 11   Insulin  Pen Needle 32G X 4 MM MISC Use with insulin  pens to administer insulin . 200 each 3   Lancets Misc. (ACCU-CHEK MULTICLIX LANCET DEV) KIT USE AS DIRECTED 1 each 0   Urine Glucose-Ketones Test STRP Use to check urine in cases of hyperglycemia 50 strip 6   No current facility-administered medications on file prior to visit.   Allergies  Allergen Reactions   Humalog  [Insulin  Lispro] Shortness Of Breath, Diarrhea and Other (See Comments)    Headaches-    Family History  Problem Relation Age of Onset   Thyroid  disease Mother        Papillary thyroid  cancer   Cancer Mother    PE: BP 120/60   Pulse (!) 104   Ht 5' 8.94 (1.751 m)   Wt 158 lb 6.4 oz (71.8 kg)   SpO2 97%   BMI 23.43 kg/m  Wt Readings from Last 15 Encounters:  06/20/24 158 lb 6.4 oz (71.8 kg)  03/14/24 169 lb 6.4 oz (76.8 kg)  12/07/23 187 lb 9.6 oz (85.1 kg)  07/13/23 182 lb 9.6 oz (82.8 kg)  02/20/23 158 lb 3.2 oz (71.8 kg)  11/20/22 155 lb 9.6 oz (70.6 kg)  07/01/22 166 lb 3.2 oz (75.4 kg)  06/11/21 147 lb 4 oz (66.8 kg)  01/29/21 143 lb 3.2 oz (65 kg)  08/28/20 159 lb 12.8 oz (72.5 kg)  02/01/20 165 lb 6.4 oz (75 kg) (67%, Z= 0.43)*  06/07/19 164 lb 6.4 oz (74.6 kg) (69%, Z= 0.48)*  10/26/18 157 lb 3.2 oz (71.3 kg) (63%, Z= 0.32)*  06/24/18 153 lb 9.6 oz (69.7 kg) (60%, Z= 0.25)*  04/19/18 150 lb 12.8 oz (68.4 kg) (57%, Z=  0.18)*   * Growth percentiles are based on CDC (Boys, 2-20 Years) data.   Constitutional: Normal weight, in NAD Eyes:  EOMI, no exophthalmos ENT: no neck masses, no cervical lymphadenopathy Cardiovascular: tachycardia, RR, No MRG Respiratory: CTA B Musculoskeletal: no deformities Skin:no rashes Neurological: no tremor with outstretched hands  ASSESSMENT: 1. DM1, uncontrolled, without long-term complications, but with hyperglycemia - h/o DKA at diagnosis, in 2008  2.  Insulin  pump in place  3. Goiter  PLAN:  1. + 2. Patient with longstanding, fairly well-controlled type 1 diabetes, on the t:slim X2 insulin  pump integrated with a Dexcom CGM.  At last visit HbA1c was lower than previously, at 6.2%.  However, sugars appears to be slightly higher than expected from his HbA1c, increasing after breakfast and then also after dinner.  The breakfast increase in blood sugars was occurring even if he was entering the appropriate amount of carbs and starting bolusing before the meal.  Therefore, I recommended to strengthened the insulin  to carb ratio for breakfast.  Regarding dinner, he was not entering carbs appropriately for this meal, only between 0 to 15 g of carbs, which we discussed, was not enough.  I advised him to try his best to enter all of the carbs into the pump at the beginning of the meal.  I again underlined the need to do dual wave boluses with fattier meals.  We did not change his regimen otherwise.  He was planning to travel to [place] and we discussed about a backup regimen  in case his pump failed. - I previously recommended a reference book for diabetes type 1 management. CGM interpretation: -At today's visit, we reviewed his CGM downloads: It appears that 91% of values are in target range (goal >70%), while 8.3% are higher than 180 (goal <25%), and 0.7% are lower than 70 (goal <4%).  The calculated average blood sugar is 133.  The projected HbA1c for the next 3 months (GMI) is  6.5%. -Reviewing the CGM trends, sugars appear to be improved from before, excellent overnight and increasing after meals but with most of her values under 180 mg/dL.  He is still not entering a lot of carbs into his pump (see HPI), and prefers to do multiple manual boluses.  However, most of the time, this is working well for him.  The patient and also his mother confirmed that he is not bolusing all the insulin  as he is afraid that he may drop his blood sugars.  We discussed about trying to increase the carbs that he put in the carbs slightly, to gain more confidence in how much insulin  the pump is giving him.  He agrees to try this. -I also advised him to download at very the app so he can give us  permission to see his Dexcom tracings. -I advised him to: Patient Instructions  Please continue: - basal rates: 12 am: 1.05 units/h 2 am: 0.95 8 am: 1.15 - ICR:             12 am: 1:8             4 am: 1:6 2 pm: 1:6 6 pm: 1:5 11 PM: 1:8 - ISF: 12 am: 1:40 8 am: 1:30 11 PM: 1:40 - target: 12 am: 110  Log into [employer] app and grant us  permission to see the CGM traces: [phone].   Please return in 4-6 months.  - we checked his HbA1c: 6.4% (only slightly higher) - advised to check sugars at different times of the day - 4x a day, rotating check times - advised for yearly eye exams >> he is not UTD - return to clinic in 4-6 months  3. Goiter - Diagnosed when he was seeing Dr. Hershal as a child - TSH was normal: Lab Results  Component Value Date   TSH 0.96 12/07/2023  - He denies neck compression symptoms - Will continue to follow him expectantly  Lela Fendt, MD PhD [facility] Endocrinology

## 2024-12-19 ENCOUNTER — Ambulatory Visit: Admitting: Internal Medicine
